# Patient Record
Sex: Female | Born: 1995 | Race: Black or African American | Hispanic: No | Marital: Single | Smoking: Former smoker
Health system: Southern US, Community
[De-identification: ages and names within clinical notes are randomized; demographics above are authoritative.]

## PROBLEM LIST (undated history)

## (undated) ENCOUNTER — Ambulatory Visit (HOSPITAL_COMMUNITY): Discharge status: Absent without leave | Payer: Self-pay

## (undated) ENCOUNTER — Inpatient Hospital Stay (HOSPITAL_COMMUNITY): Payer: Self-pay

## (undated) ENCOUNTER — Emergency Department (HOSPITAL_BASED_OUTPATIENT_CLINIC_OR_DEPARTMENT_OTHER): Admission: EM | Payer: Self-pay | Source: Home / Self Care

## (undated) DIAGNOSIS — J45909 Unspecified asthma, uncomplicated: Secondary | ICD-10-CM

## (undated) DIAGNOSIS — R011 Cardiac murmur, unspecified: Secondary | ICD-10-CM

## (undated) DIAGNOSIS — K219 Gastro-esophageal reflux disease without esophagitis: Secondary | ICD-10-CM

## (undated) DIAGNOSIS — F419 Anxiety disorder, unspecified: Secondary | ICD-10-CM

## (undated) HISTORY — DX: Cardiac murmur, unspecified: R01.1

## (undated) HISTORY — PX: TYMPANOSTOMY TUBE PLACEMENT: SHX32

## (undated) HISTORY — PX: TONSILLECTOMY: SUR1361

---
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
## 2015-07-29 ENCOUNTER — Encounter (HOSPITAL_COMMUNITY): Payer: Self-pay | Admitting: *Deleted

## 2015-07-29 ENCOUNTER — Emergency Department (HOSPITAL_COMMUNITY)
Admission: EM | Admit: 2015-07-29 | Discharge: 2015-07-29 | Disposition: A | Discharge status: Home / Self Care | Payer: Medicaid Other | Attending: Emergency Medicine | Admitting: Emergency Medicine

## 2015-07-29 DIAGNOSIS — K219 Gastro-esophageal reflux disease without esophagitis: Secondary | ICD-10-CM | POA: Diagnosis not present

## 2015-07-29 DIAGNOSIS — Z79899 Other long term (current) drug therapy: Secondary | ICD-10-CM | POA: Diagnosis not present

## 2015-07-29 DIAGNOSIS — L590 Erythema ab igne [dermatitis ab igne]: Secondary | ICD-10-CM | POA: Insufficient documentation

## 2015-07-29 DIAGNOSIS — Z8659 Personal history of other mental and behavioral disorders: Secondary | ICD-10-CM | POA: Diagnosis not present

## 2015-07-29 DIAGNOSIS — R011 Cardiac murmur, unspecified: Secondary | ICD-10-CM | POA: Insufficient documentation

## 2015-07-29 DIAGNOSIS — R21 Rash and other nonspecific skin eruption: Secondary | ICD-10-CM | POA: Diagnosis present

## 2015-07-29 MED ORDER — MUPIROCIN CALCIUM 2 % EX CREA: 1.0000 "application " | TOPICAL_CREAM | Freq: Two times a day (BID) | CUTANEOUS | Status: AC

## 2015-07-29 NOTE — ED Notes (Signed)
Pt presents with reddened rash to buttocks bilat.  Pt c/o itching.

## 2015-07-29 NOTE — ED Provider Notes (Signed)
CSN: [id number]     Arrival date & time 07/29/15  2007 History  By signing my name below, I, Regina Miller, attest that this documentation has been prepared under the direction and in the presence of  Earley Favor, NP. Electronically Signed: Doreatha Miller, ED Scribe. 07/29/2015. 9:24 PM.     Chief Complaint  Patient presents with  . Rash   The history is provided by the patient. No language interpreter was used.    HPI Comments: Regina Miller is a 19 y.o. female who presents to the Emergency Department complaining of a pruritic, non-painful rash to the buttock that has spread to the upper dorsal thighs onset 3 days ago. Pt states that she used nair on her buttock 3 days ago and noticed the rash after removing the nair. She states she used the regular depilatory, not the bikini line version. No rashes anywhere else.   Past Medical History  Diagnosis Date  . Anxiety   . Attention deficit disorder   . Acid reflux   . Heart murmur     Reports she had a heart murmur as a baby that has been evaluated. She is not currently followed by a cardiologist.   Past Surgical History  Procedure Laterality Date  . Tonsillectomy    . Tympanostomy tube placement Bilateral    Family History  Problem Relation Age of Onset  . Psoriasis Mother   . Hypertension Father   . Eczema Father   . Diabetes Father   . Heart disease Paternal Grandmother   . Cancer Maternal Grandmother    Social History  Substance Use Topics  . Smoking status: Passive Smoke Exposure - Never Smoker  . Smokeless tobacco: None     Comment: Parents smoke around her  . Alcohol Use: No   OB History    No data available     Review of Systems  Skin: Positive for rash ( pruritic, not painful).  All other systems reviewed and are negative.  Allergies  Other  Home Medications   Prior to Admission medications   Medication Sig Start Date End Date Taking? Authorizing Provider  azithromycin (ZITHROMAX) 500 MG tablet Take 2  tablets (1,000 mg total) by mouth once. Patient not taking: Reported on 07/29/2015 05/17/14   Linna Hoff, MD  fluconazole (DIFLUCAN) 150 MG tablet Take 1 tablet (150 mg total) by mouth daily. Repeat dose in 3 days Patient not taking: Reported on 07/29/2015 06/06/14   Ozella Rocks, MD  hydrocortisone 2.5 % ointment Apply topically 2 (two) times daily. As needed for mild eczema.  Do not use for more than 1-2 weeks at a time. Patient not taking: Reported on 07/29/2015 12/22/13   Radene Gunning, MD  metroNIDAZOLE (FLAGYL) 500 MG tablet Take 1 tablet (500 mg total) by mouth 2 (two) times daily. Patient not taking: Reported on 07/29/2015 05/17/14   Linna Hoff, MD  mupirocin cream (BACTROBAN) 2 % Apply 1 application topically 2 (two) times daily. 07/29/15 08/03/15  Earley Favor, NP  neomycin-polymyxin-hydrocortisone (CORTISPORIN) 3.5-10000-1 otic suspension Place 4 drops into both ears 4 (four) times daily. Patient not taking: Reported on 07/29/2015 05/14/14   Servando Salina, NP  ondansetron (ZOFRAN-ODT) 4 MG disintegrating tablet 1 tablet by mouth 5 minutes prior to taking antibiotic Patient not taking: Reported on 07/29/2015 05/18/14   Graylon Good, PA-C  Probiotic Product (ALIGN) 4 MG CAPS Take 1 capsule (4 mg total) by mouth daily. Patient not taking: Reported on 07/29/2015  05/14/14   Servando Salina, NP   BP 119/56 mmHg  Pulse 65  Temp(Src) 98.6 F (37 C) (Oral)  Resp 16  Ht  (1.549 m)  Wt 210 lb (95.255 kg)  BMI 39.70 kg/m2  SpO2 100% Physical Exam  Constitutional: She is oriented to person, place, and time. She appears well-developed and well-nourished.  HENT:  Head: Normocephalic and atraumatic.  Eyes: Conjunctivae and EOM are normal. Pupils are equal, round, and reactive to light.  Neck: Normal range of motion. Neck supple.  Cardiovascular: Normal rate.   Pulmonary/Chest: Effort normal. No respiratory distress.  Abdominal: She exhibits no distension.  Musculoskeletal: Normal  range of motion.  Neurological: She is alert and oriented to person, place, and time.  Skin: Skin is warm and dry. Rash noted.  Fine rash at base of each hair follical   Psychiatric: She has a normal mood and affect. Her behavior is normal.  Nursing note and vitals reviewed.  ED Course  Procedures (including critical care time) DIAGNOSTIC STUDIES: Oxygen Saturation is 100% on RA, normal by my interpretation.    COORDINATION OF CARE: 9:24 PM Discussed treatment plan with pt at bedside and pt agreed to plan.   MDM   Final diagnoses:  Dermatitis ab igne    I personally performed the services described in this documentation, which was scribed in my presence. The recorded information has been reviewed and is accurate.  Earley Favor, NP 07/29/15 2200  Alvira Monday, MD 08/01/15 Moses Manners

## 2015-07-29 NOTE — ED Notes (Signed)
PA informed of pt's request.  Pt provided option to stay for exam.  Pt stated she "will just wait and go somewhere else later."

## 2015-07-29 NOTE — ED Notes (Signed)
Pt states "I used Nair 2-3 days ago to remove the hair.  I used the body one".

## 2015-07-29 NOTE — ED Notes (Signed)
Pt now requesting to be checked for a UTI & stating "I've also been having a d/c."

## 2015-07-29 NOTE — Discharge Instructions (Signed)
You have a local reaction to the use of nair with potentially a superficial skin infection at the base of the hair follicles  Use the ointment as instructed 2 times a day for 5 days

## 2015-08-23 ENCOUNTER — Emergency Department (HOSPITAL_COMMUNITY)
Admission: EM | Admit: 2015-08-23 | Discharge: 2015-08-23 | Disposition: A | Discharge status: Home / Self Care | Payer: Medicaid Other | Attending: Emergency Medicine | Admitting: Emergency Medicine

## 2015-08-23 DIAGNOSIS — R011 Cardiac murmur, unspecified: Secondary | ICD-10-CM | POA: Diagnosis not present

## 2015-08-23 DIAGNOSIS — Z8719 Personal history of other diseases of the digestive system: Secondary | ICD-10-CM | POA: Diagnosis not present

## 2015-08-23 DIAGNOSIS — H9202 Otalgia, left ear: Secondary | ICD-10-CM | POA: Diagnosis present

## 2015-08-23 DIAGNOSIS — H66002 Acute suppurative otitis media without spontaneous rupture of ear drum, left ear: Secondary | ICD-10-CM | POA: Insufficient documentation

## 2015-08-23 DIAGNOSIS — Z8659 Personal history of other mental and behavioral disorders: Secondary | ICD-10-CM | POA: Insufficient documentation

## 2015-08-23 DIAGNOSIS — R0981 Nasal congestion: Secondary | ICD-10-CM | POA: Insufficient documentation

## 2015-08-23 MED ORDER — TRAMADOL HCL 50 MG PO TABS: 50.0000 mg | ORAL_TABLET | Freq: Four times a day (QID) | ORAL | Status: DC | PRN

## 2015-08-23 MED ORDER — AMOXICILLIN 500 MG PO CAPS: 500.0000 mg | ORAL_CAPSULE | Freq: Three times a day (TID) | ORAL | Status: DC

## 2015-08-23 NOTE — Discharge Instructions (Signed)

## 2015-08-23 NOTE — ED Notes (Signed)
Pt c/o pain in L ear, states its hard to hear out of it.

## 2015-08-23 NOTE — ED Provider Notes (Signed)
CSN: [id number]     Arrival date & time 08/23/15  2121 History  By signing my name below, I, Regina Miller, attest that this documentation has been prepared under the direction and in the presence of Arthor CaptainAbigail Halim Surrette, PA-C.  Electronically Signed: [name], ED Scribe. 08/23/2015. 9:45 PM.  Chief Complaint  Patient presents with  . Otalgia   The history is provided by the patient. No language interpreter was used.     HPI Comments: Regina Miller is a 19 y.o. female who presents to the Emergency Department complaining of gradual onset, intermittent, 4/10, left ear pain x 2-3 days, gradually worsening. Pt also complains of muffled hearing and nasal congestion. Pt notes that she had bilateral ear pain but put hydrogen peroxide in ears which relieved the right ear but had no relief with the left ear. Denies fever, neck stiffness, or any other associated symptoms. No recent altitude changes.   Past Medical History  Diagnosis Date  . Anxiety   . Attention deficit disorder   . Acid reflux   . Heart murmur     Reports she had a heart murmur as a baby that has been evaluated. She is not currently followed by a cardiologist.   Past Surgical History  Procedure Laterality Date  . Tonsillectomy    . Tympanostomy tube placement Bilateral    Family History  Problem Relation Age of Onset  . Psoriasis Mother   . Hypertension Father   . Eczema Father   . Diabetes Father   . Heart disease Paternal Grandmother   . Cancer Maternal Grandmother    Social History  Substance Use Topics  . Smoking status: Passive Smoke Exposure - Never Smoker  . Smokeless tobacco: Not on file     Comment: Parents smoke around her  . Alcohol Use: No   OB History    No data available     Review of Systems  Constitutional: Negative for fever and chills.  HENT: Positive for congestion and ear pain (Right ear). Negative for ear discharge.        + Muffled hearing of right ear   Allergies  Other  Home  Medications   Prior to Admission medications   Medication Sig Start Date End Date Taking? Authorizing Provider  azithromycin (ZITHROMAX) 500 MG tablet Take 2 tablets (1,000 mg total) by mouth once. Patient not taking: Reported on 07/29/2015 05/17/14   Linna HoffJames D Kindl, MD  fluconazole (DIFLUCAN) 150 MG tablet Take 1 tablet (150 mg total) by mouth daily. Repeat dose in 3 days Patient not taking: Reported on 07/29/2015 06/06/14   Ozella Rocksavid J Merrell, MD  hydrocortisone 2.5 % ointment Apply topically 2 (two) times daily. As needed for mild eczema.  Do not use for more than 1-2 weeks at a time. Patient not taking: Reported on 07/29/2015 12/22/13   Radene Gunningameron E Lang, MD  metroNIDAZOLE (FLAGYL) 500 MG tablet Take 1 tablet (500 mg total) by mouth 2 (two) times daily. Patient not taking: Reported on 07/29/2015 05/17/14   Linna HoffJames D Kindl, MD  neomycin-polymyxin-hydrocortisone (CORTISPORIN) 3.5-10000-1 otic suspension Place 4 drops into both ears 4 (four) times daily. Patient not taking: Reported on 07/29/2015 05/14/14   Servando Salinaatherine H Rossi, NP  ondansetron (ZOFRAN-ODT) 4 MG disintegrating tablet 1 tablet by mouth 5 minutes prior to taking antibiotic Patient not taking: Reported on 07/29/2015 05/18/14   Graylon GoodZachary H Baker, PA-C  Probiotic Product (ALIGN) 4 MG CAPS Take 1 capsule (4 mg total) by mouth daily. Patient not  taking: Reported on 07/29/2015 05/14/14   Servando Salina, NP   Triage Vitals: BP 138/68 mmHg  Pulse 100  Temp(Src) 98.5 F (36.9 C) (Oral)  Resp 16  SpO2 100%   Physical Exam  Constitutional: She is oriented to person, place, and time. She appears well-developed and well-nourished. No distress.  HENT:  Head: Normocephalic and atraumatic.  Right TM is retracted; small burn from the peroxide  Left TM; air fluid levels with fluid behind drums; erythematous  Eyes: Conjunctivae and EOM are normal.  Neck: Neck supple. No tracheal deviation present.  Cardiovascular: Normal rate.   Pulmonary/Chest: Effort normal.  No respiratory distress.  Musculoskeletal: Normal range of motion.  Neurological: She is alert and oriented to person, place, and time.  Skin: Skin is warm and dry.  Psychiatric: She has a normal mood and affect. Her behavior is normal.  Nursing note and vitals reviewed.   ED Course  Procedures (including critical care time)  DIAGNOSTIC STUDIES: Oxygen Saturation is 100%, on RA, normal by my interpretation.    COORDINATION OF CARE: 9:43 PM-Discussed treatment plan which includes rx amoxicillin with pt at bedside and pt agreed to plan.   Labs Review Labs Reviewed - No data to display  Imaging Review No results found.   EKG Interpretation None      MDM   Final diagnoses:  Acute suppurative otitis media of left ear without spontaneous rupture of tympanic membrane, recurrence not specified   Patient presents with otalgia and exam consistent with acute otitis media. No concern for acute mastoiditis, meningitis.  No antibiotic use in the last month.  Patient discharged home with Amoxicillin. .  I have also discussed reasons to return immediately to the ER.  Parent expresses understanding and agrees with plan.     I personally performed the services described in this documentation, which was scribed in my presence. The recorded information has been reviewed and is accurate.         Arthor Captain, PA-C 08/23/15 2212  Pricilla Loveless, MD 08/24/15 [phone]

## 2015-09-06 ENCOUNTER — Emergency Department (HOSPITAL_COMMUNITY)
Admission: EM | Admit: 2015-09-06 | Discharge: 2015-09-06 | Disposition: A | Discharge status: Home / Self Care | Payer: Medicaid Other | Attending: Emergency Medicine | Admitting: Emergency Medicine

## 2015-09-06 ENCOUNTER — Encounter (HOSPITAL_COMMUNITY): Payer: Self-pay | Admitting: Cardiology

## 2015-09-06 DIAGNOSIS — N72 Inflammatory disease of cervix uteri: Secondary | ICD-10-CM | POA: Diagnosis not present

## 2015-09-06 DIAGNOSIS — R011 Cardiac murmur, unspecified: Secondary | ICD-10-CM | POA: Insufficient documentation

## 2015-09-06 DIAGNOSIS — Z8719 Personal history of other diseases of the digestive system: Secondary | ICD-10-CM | POA: Insufficient documentation

## 2015-09-06 DIAGNOSIS — N390 Urinary tract infection, site not specified: Secondary | ICD-10-CM

## 2015-09-06 DIAGNOSIS — F419 Anxiety disorder, unspecified: Secondary | ICD-10-CM | POA: Insufficient documentation

## 2015-09-06 DIAGNOSIS — R509 Fever, unspecified: Secondary | ICD-10-CM | POA: Diagnosis present

## 2015-09-06 DIAGNOSIS — Z3202 Encounter for pregnancy test, result negative: Secondary | ICD-10-CM | POA: Diagnosis not present

## 2015-09-06 LAB — URINALYSIS, ROUTINE W REFLEX MICROSCOPIC
Bilirubin Urine: NEGATIVE
Bilirubin Urine: NEGATIVE
Glucose, UA: NEGATIVE mg/dL
Glucose, UA: NEGATIVE mg/dL
Ketones, ur: NEGATIVE mg/dL
Ketones, ur: NEGATIVE mg/dL
NITRITE: NEGATIVE
Nitrite: NEGATIVE
PH: 6 (ref 5.0–8.0)
Protein, ur: 30 mg/dL — AB
Protein, ur: NEGATIVE mg/dL
SPECIFIC GRAVITY, URINE: 1.026 (ref 1.005–1.030)
SPECIFIC GRAVITY, URINE: 1.023 (ref 1.005–1.030)
pH: 6 (ref 5.0–8.0)

## 2015-09-06 LAB — URINE MICROSCOPIC-ADD ON

## 2015-09-06 LAB — WET PREP, GENITAL
Clue Cells Wet Prep HPF POC: NONE SEEN
Sperm: NONE SEEN
Trich, Wet Prep: NONE SEEN
Yeast Wet Prep HPF POC: NONE SEEN

## 2015-09-06 LAB — POC URINE PREG, ED: PREG TEST UR: NEGATIVE

## 2015-09-06 MED ORDER — NITROFURANTOIN MONOHYD MACRO 100 MG PO CAPS: 100.0000 mg | ORAL_CAPSULE | Freq: Two times a day (BID) | ORAL | Status: DC

## 2015-09-06 MED ORDER — ONDANSETRON 4 MG PO TBDP
8.0000 mg | ORAL_TABLET | Freq: Once | ORAL | Status: AC
  Administered 2015-09-06: 8 mg via ORAL
  Filled 2015-09-06: qty 2

## 2015-09-06 MED ORDER — ONDANSETRON 4 MG PO TBDP: 4.0000 mg | ORAL_TABLET | Freq: Three times a day (TID) | ORAL | Status: DC | PRN

## 2015-09-06 MED ORDER — LIDOCAINE HCL (PF) 1 % IJ SOLN
2.0000 mL | Freq: Once | INTRAMUSCULAR | Status: AC
  Administered 2015-09-06: 2 mL via INTRADERMAL
  Filled 2015-09-06: qty 5

## 2015-09-06 MED ORDER — CEFTRIAXONE SODIUM 250 MG IJ SOLR
250.0000 mg | Freq: Once | INTRAMUSCULAR | Status: AC
  Administered 2015-09-06: 250 mg via INTRAMUSCULAR
  Filled 2015-09-06: qty 250

## 2015-09-06 MED ORDER — AZITHROMYCIN 250 MG PO TABS
1000.0000 mg | ORAL_TABLET | Freq: Once | ORAL | Status: AC
  Administered 2015-09-06: 1000 mg via ORAL
  Filled 2015-09-06: qty 4

## 2015-09-06 NOTE — ED Provider Notes (Signed)
CSN: [id number]     Arrival date & time 09/06/15  1224 History   First MD Initiated Contact with Patient 09/06/15 1452     Chief Complaint  Patient presents with  . Fever  . Vaginal Discharge     (Consider location/radiation/quality/duration/timing/severity/associated sxs/prior Treatment) HPI Comments: 19 year old female who presents with fever, dysuria, and vaginal discharge. The patient states that 3-4 days ago she began having brown vaginal discharge associated with burning and dysuria. She has noted fevers up to 102 at home, last fever was 101.6 last night. She has had a mild amount of vomiting but no diarrhea. No abdominal pain. Her last menstrual period was on 10/25. She has one sexual partner and does not use protection. She does have a history of chlamydia.  Patient is a 19 y.o. female presenting with fever and vaginal discharge. The history is provided by the patient.  Fever Vaginal Discharge Associated symptoms: fever     Past Medical History  Diagnosis Date  . Anxiety   . Attention deficit disorder   . Acid reflux   . Heart murmur     Reports she had a heart murmur as a baby that has been evaluated. She is not currently followed by a cardiologist.   Past Surgical History  Procedure Laterality Date  . Tonsillectomy    . Tympanostomy tube placement Bilateral    Family History  Problem Relation Age of Onset  . Psoriasis Mother   . Hypertension Father   . Eczema Father   . Diabetes Father   . Heart disease Paternal Grandmother   . Cancer Maternal Grandmother    Social History  Substance Use Topics  . Smoking status: Current Every Day Smoker  . Smokeless tobacco: None     Comment: Parents smoke around her  . Alcohol Use: Yes   OB History    No data available     Review of Systems  Constitutional: Positive for fever.  Genitourinary: Positive for vaginal discharge.   10 Systems reviewed and are negative for acute change except as noted in the  HPI.    Allergies  Other  Home Medications   Prior to Admission medications   Medication Sig Start Date End Date Taking? Authorizing Provider  traMADol (ULTRAM) 50 MG tablet Take 1 tablet (50 mg total) by mouth every 6 (six) hours as needed. 08/23/15  Yes Arthor CaptainAbigail Harris, PA-C  nitrofurantoin, macrocrystal-monohydrate, (MACROBID) 100 MG capsule Take 1 capsule (100 mg total) by mouth 2 (two) times daily. 09/06/15   Ambrose Finlandachel Morgan Taro Hidrogo, MD   BP 105/74 mmHg  Pulse 84  Temp(Src) 99 F (37.2 C) (Oral)  Resp 16  Ht 5\' 5"  (1.651 m)  Wt 210 lb (95.255 kg)  BMI 34.95 kg/m2  SpO2 100%  LMP 08/07/2015 Physical Exam  Constitutional: She is oriented to person, place, and time. She appears well-developed and well-nourished. No distress.  HENT:  Head: Normocephalic and atraumatic.  Mouth/Throat: Oropharynx is clear and moist.  Moist mucous membranes  Eyes: Conjunctivae are normal. Pupils are equal, round, and reactive to light.  Neck: Neck supple.  Cardiovascular: Normal rate, regular rhythm and normal heart sounds.   No murmur heard. Pulmonary/Chest: Effort normal and breath sounds normal.  Abdominal: Soft. Bowel sounds are normal. She exhibits no distension. There is no tenderness.  Genitourinary:  large amount of thin discharge in vaginal vault and on bed, strawberry cervix with significant irritation of mucosa, no adnexal or cervical motion tenderness  Musculoskeletal: She exhibits no edema.  Neurological: She is alert and oriented to person, place, and time.  Fluent speech  Skin: Skin is warm and dry. No rash noted.  Psychiatric: She has a normal mood and affect. Judgment normal.  Nursing note and vitals reviewed. Chaperone was present during exam.   ED Course  Procedures (including critical care time) Labs Review Labs Reviewed  WET PREP, GENITAL - Abnormal; Notable for the following:    WBC, Wet Prep HPF POC MANY (*)    All other components within normal limits   URINALYSIS, ROUTINE W REFLEX MICROSCOPIC (NOT AT [facility]) - Abnormal; Notable for the following:    APPearance CLOUDY (*)    Hgb urine dipstick MODERATE (*)    Protein, ur 30 (*)    Leukocytes, UA MODERATE (*)    All other components within normal limits  URINE MICROSCOPIC-ADD ON - Abnormal; Notable for the following:    Squamous Epithelial / LPF 6-30 (*)    Bacteria, UA MANY (*)    All other components within normal limits  URINALYSIS, ROUTINE W REFLEX MICROSCOPIC (NOT AT [facility]) - Abnormal; Notable for the following:    Hgb urine dipstick LARGE (*)    Leukocytes, UA SMALL (*)    All other components within normal limits  URINE MICROSCOPIC-ADD ON - Abnormal; Notable for the following:    Squamous Epithelial / LPF 0-5 (*)    Bacteria, UA RARE (*)    All other components within normal limits  URINE CULTURE  POC URINE PREG, ED  GC/CHLAMYDIA PROBE AMP ([place]) NOT AT [facility]    Imaging Review No results found. I have personally reviewed and evaluated these lab results as part of my medical decision-making.   EKG Interpretation None     Medications  cefTRIAXone (ROCEPHIN) injection 250 mg (not administered)  lidocaine (PF) (XYLOCAINE) 1 % injection 2 mL (not administered)  ondansetron (ZOFRAN-ODT) disintegrating tablet 8 mg (not administered)  azithromycin (ZITHROMAX) tablet 1,000 mg (not administered)    MDM   Final diagnoses:  Cervicitis  UTI (lower urinary tract infection)   She presents with several days of vaginal discharge, dysuria, and intermittent fevers. She denies any abdominal pain. Patient well-appearing with normal vital signs. No abdominal tenderness on exam. GU exam showed large amount of thin vaginal discharge and strawberry cervix c/w infection. Gave pt CTX and azithromycin. GC/chlamydia and wet prep sent.  Obtained repeat UA for contamination; repeat shows WBCs, bacteria, and leuks. Will treat with outpt abx for UTI given dysuria. Instructed on supportive  care and treating partner before any sexual contact. Patient voiced understanding of return precautions and was discharged in satisfactory condition.      Laurence Spates, MD 09/06/15 [phone]

## 2015-09-06 NOTE — ED Notes (Signed)
Reports a fever, vaginal discharge, and dysuria for the past couple of days.

## 2015-09-07 LAB — URINE CULTURE
Culture: NO GROWTH
SPECIAL REQUESTS: NORMAL

## 2015-09-07 LAB — GC/CHLAMYDIA PROBE AMP (~~LOC~~) NOT AT ARMC
Chlamydia: NEGATIVE
NEISSERIA GONORRHEA: NEGATIVE

## 2016-05-15 ENCOUNTER — Encounter: Payer: Self-pay | Admitting: Pediatrics

## 2016-10-14 ENCOUNTER — Encounter (HOSPITAL_COMMUNITY): Payer: Self-pay | Admitting: *Deleted

## 2016-10-14 DIAGNOSIS — I493 Ventricular premature depolarization: Secondary | ICD-10-CM | POA: Insufficient documentation

## 2016-10-14 DIAGNOSIS — R002 Palpitations: Secondary | ICD-10-CM | POA: Diagnosis present

## 2016-10-14 DIAGNOSIS — F17200 Nicotine dependence, unspecified, uncomplicated: Secondary | ICD-10-CM | POA: Insufficient documentation

## 2016-10-14 NOTE — ED Triage Notes (Signed)
Pt c/o heart palpitations x 1 week; pt denies any extra caffeine or diet pills

## 2016-10-15 ENCOUNTER — Emergency Department (HOSPITAL_COMMUNITY)
Admission: EM | Admit: 2016-10-15 | Discharge: 2016-10-15 | Disposition: A | Discharge status: Home / Self Care | Payer: Medicaid Other | Attending: Emergency Medicine | Admitting: Emergency Medicine

## 2016-10-15 DIAGNOSIS — R002 Palpitations: Secondary | ICD-10-CM

## 2016-10-15 DIAGNOSIS — I493 Ventricular premature depolarization: Secondary | ICD-10-CM

## 2016-10-15 NOTE — ED Provider Notes (Signed)
[facility]-EMER[facility]Y DEPT Provider Note   CSN: [id number] Arrival date & time: 10/14/16  2323     History   Chief Complaint Chief Complaint  Patient presents with  . Palpitations    HPI Regina Miller is a 20 y.o. female.  HPI Patient presents emergency department complaints of occasional palpitations over the past week.  She reports his palpitations last a few seconds and feels like a skipped beat.  She denies near syncope or syncope.  No new medications.  Patient denies use of caffeine.  No over-the-counter herbal supplements.  No palpitations at this time.  Symptoms are mild in severity.   Past Medical History:  Diagnosis Date  . Acid reflux   . Anxiety   . Attention deficit disorder   . Heart murmur    Reports she had a heart murmur as a baby that has been evaluated. She is not currently followed by a cardiologist.    Patient Active Problem List   Diagnosis Date Noted  . Anxiety 12/22/2013  . Eczema 12/22/2013  . Body mass index, pediatric, greater than or 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 age 73/02/2014  . Acne 04/26/2012  . Insomnia 07/06/2011  . GERD 09/19/2008  . ATTENTION DEFICIT, W/HYPERACTIVITY 12/17/2006    Past Surgical History:  Procedure Laterality Date  . TONSILLECTOMY    . TYMPANOSTOMY TUBE PLACEMENT Bilateral     OB History    No data available       Home Medications    Prior to Admission medications   Not on File    Family History Family History  Problem Relation Age of Onset  . Psoriasis Mother   . Hypertension Father   . Eczema Father   . Diabetes Father   . Heart disease Paternal Grandmother   . Cancer Maternal Grandmother     Social History Social History  Substance Use Topics  . Smoking status: Current Every Day Smoker  . Smokeless tobacco: Never Used     Comment: Parents smoke around her  . Alcohol use Yes     Allergies   Other   Review of Systems Review of Systems  All other systems reviewed and are  negative.    Physical Exam Updated Vital Signs BP 125/65   Pulse 72   Temp 97.9 F (36.6 C) (Oral)   Resp 22   Ht 5\' 2"  (1.575 m)   Wt 188 lb (85.3 kg)   LMP 09/23/2016   SpO2 100%   BMI 34.39 kg/m   Physical Exam  Constitutional: She is oriented to person, place, and time. She appears well-developed and well-nourished.  HENT:  Head: Normocephalic.  Eyes: EOM are normal.  Neck: Normal range of motion.  Cardiovascular: Normal rate and regular rhythm.   Pulmonary/Chest: Effort normal and breath sounds normal.  Abdominal: She exhibits no distension.  Musculoskeletal: Normal range of motion.  Neurological: She is alert and oriented to person, place, and time.  Psychiatric: She has a normal mood and affect.  Nursing note and vitals reviewed.    ED Treatments / Results  Labs (all labs ordered are listed, but only abnormal results are displayed) Labs Reviewed - No data to display  EKG  EKG Interpretation  Date/Time:  Wednesday October 15 2016 01:04:56 EST Ventricular Rate:  75 PR Interval:    QRS Duration: 81 QT Interval:  376 QTC Calculation: 420 R Axis:   40 Text Interpretation:  Sinus rhythm Low voltage, precordial leads No significant change was found Confirmed by  Landen Knoedler  MD, Caryn BeeKEVIN ([id number]) on 10/15/2016 1:36:43 AM       Radiology No results found.  Procedures Procedures (including critical care time)  Medications Ordered in ED Medications - No data to display   Initial Impression / Assessment and Plan / ED Course  I have reviewed the triage vital signs and the nursing notes.  Pertinent labs & imaging results that were available during my care of the patient were reviewed by me and considered in my medical decision making (see chart for details).  Clinical Course     Occasional PVCs noted on the monitor.  These seem to correlate with patient's symptoms.  Outpatient cardiology if her symptoms persist that she may benefit from Holter but I suspect  the majority of this is all PVCs.  This was explained to the patient.  Recommend oral hydration and avoidance of caffeine and other stimulants   Final Clinical Impressions(s) / ED Diagnoses   Final diagnoses:  Palpitations  PVC's (premature ventricular contractions)    New Prescriptions There are no discharge medications for this patient.    Azalia BilisKevin Gavriel Holzhauer, MD 10/15/16 [phone]

## 2017-07-23 ENCOUNTER — Encounter (HOSPITAL_COMMUNITY): Payer: Self-pay | Admitting: Emergency Medicine

## 2017-07-23 ENCOUNTER — Emergency Department (HOSPITAL_COMMUNITY)
Admission: EM | Admit: 2017-07-23 | Discharge: 2017-07-23 | Disposition: A | Discharge status: Home / Self Care | Payer: Medicaid Other | Attending: Emergency Medicine | Admitting: Emergency Medicine

## 2017-07-23 DIAGNOSIS — O99519 Diseases of the respiratory system complicating pregnancy, unspecified trimester: Secondary | ICD-10-CM | POA: Diagnosis not present

## 2017-07-23 DIAGNOSIS — J45909 Unspecified asthma, uncomplicated: Secondary | ICD-10-CM | POA: Insufficient documentation

## 2017-07-23 DIAGNOSIS — O219 Vomiting of pregnancy, unspecified: Secondary | ICD-10-CM

## 2017-07-23 DIAGNOSIS — R112 Nausea with vomiting, unspecified: Secondary | ICD-10-CM | POA: Diagnosis present

## 2017-07-23 DIAGNOSIS — F17210 Nicotine dependence, cigarettes, uncomplicated: Secondary | ICD-10-CM | POA: Insufficient documentation

## 2017-07-23 DIAGNOSIS — Z3A00 Weeks of gestation of pregnancy not specified: Secondary | ICD-10-CM | POA: Insufficient documentation

## 2017-07-23 DIAGNOSIS — O99330 Smoking (tobacco) complicating pregnancy, unspecified trimester: Secondary | ICD-10-CM | POA: Diagnosis not present

## 2017-07-23 DIAGNOSIS — Z331 Pregnant state, incidental: Secondary | ICD-10-CM

## 2017-07-23 HISTORY — DX: Unspecified asthma, uncomplicated: J45.909

## 2017-07-23 LAB — URINALYSIS, ROUTINE W REFLEX MICROSCOPIC
BILIRUBIN URINE: NEGATIVE
Bilirubin Urine: NEGATIVE
Glucose, UA: NEGATIVE mg/dL
Glucose, UA: NEGATIVE mg/dL
Ketones, ur: NEGATIVE mg/dL
Ketones, ur: NEGATIVE mg/dL
NITRITE: NEGATIVE
Nitrite: NEGATIVE
PH: 6 (ref 5.0–8.0)
PROTEIN: NEGATIVE mg/dL
Protein, ur: NEGATIVE mg/dL
SPECIFIC GRAVITY, URINE: 1.02 (ref 1.005–1.030)
Specific Gravity, Urine: 1.025 (ref 1.005–1.030)
pH: 5 (ref 5.0–8.0)

## 2017-07-23 LAB — COMPREHENSIVE METABOLIC PANEL
ALT: 19 U/L (ref 14–54)
AST: 21 U/L (ref 15–41)
Albumin: 3.7 g/dL (ref 3.5–5.0)
Alkaline Phosphatase: 71 U/L (ref 38–126)
Anion gap: 7 (ref 5–15)
BUN: 7 mg/dL (ref 6–20)
CHLORIDE: 106 mmol/L (ref 101–111)
CO2: 23 mmol/L (ref 22–32)
Calcium: 9 mg/dL (ref 8.9–10.3)
Creatinine, Ser: 0.91 mg/dL (ref 0.44–1.00)
Glucose, Bld: 104 mg/dL — ABNORMAL HIGH (ref 65–99)
POTASSIUM: 3.6 mmol/L (ref 3.5–5.1)
SODIUM: 136 mmol/L (ref 135–145)
Total Bilirubin: 0.5 mg/dL (ref 0.3–1.2)
Total Protein: 6.4 g/dL — ABNORMAL LOW (ref 6.5–8.1)

## 2017-07-23 LAB — CBC
HCT: 37 % (ref 36.0–46.0)
Hemoglobin: 12.9 g/dL (ref 12.0–15.0)
MCH: 32.7 pg (ref 26.0–34.0)
MCHC: 34.9 g/dL (ref 30.0–36.0)
MCV: 93.7 fL (ref 78.0–100.0)
PLATELETS: 321 10*3/uL (ref 150–400)
RBC: 3.95 MIL/uL (ref 3.87–5.11)
RDW: 12.8 % (ref 11.5–15.5)
WBC: 10.5 10*3/uL (ref 4.0–10.5)

## 2017-07-23 LAB — I-STAT BETA HCG BLOOD, ED (MC, WL, AP ONLY)

## 2017-07-23 LAB — LIPASE, BLOOD: LIPASE: 24 U/L (ref 11–51)

## 2017-07-23 MED ORDER — PRENATAL COMPLETE 14-0.4 MG PO TABS: 1.0000 | ORAL_TABLET | Freq: Every day | ORAL | 2 refills | Status: DC

## 2017-07-23 MED ORDER — DOXYLAMINE-PYRIDOXINE 10-10 MG PO TBEC: DELAYED_RELEASE_TABLET | ORAL | 0 refills | Status: DC

## 2017-07-23 NOTE — ED Notes (Signed)
Pt given cup and instructions for collecting proper urine sample. Pt ambulated to RR with steady gait.

## 2017-07-23 NOTE — Discharge Instructions (Signed)
You are pregnant, which is why you're having the symptoms you're having. Use diclegis as directed as needed for nausea. See the list of foods below to help with nausea related to pregnancy. Stay well hydrated, get plenty of rest. Start taking prenatal vitamins. Follow up with your regular doctor and OBGYN in the next 3-5 days for recheck of symptoms and to establish prenatal care. [facility]) for emergent changes or worsening symptoms.

## 2017-07-23 NOTE — ED Provider Notes (Signed)
[facility]-EMER[facility]ENCY DEPT Provider Note   CSN: [id number] Arrival date & time: 07/23/17  1015     History   Chief Complaint Chief Complaint  Patient presents with  . Nausea  . Emesis    HPI Regina Miller is a 21 y.o. female with a PMHx of GERD, asthma, anxiety, and ADD, who presents to the ED with complaints of  1 month of intermittent breast tenderness, intermittent nausea, and vomiting initially which has since resolved 3wks ago. Patient wonders whether she may be pregnant. She states that certain smells trigger her nausea, and it typically occurs in the mornings. Her breast tenderness is also typically in the mornings. She has not tried anything for her symptoms. She has not vomited in 3 weeks, when she was vomiting it was nonbloody and nonbilious. She is sexually active with one female partner, unprotected. LMP 06/12/17. She denies any breast warmth, rashes, redness, swelling, or any skin changes. She denies any fevers, chills, CP, SOB, abdominal pain, ongoing vomiting, hematemesis, diarrhea, constipation, melena, hematochezia, dysuria, hematuria, vaginal bleeding or discharge, arthralgias, numbness, tingling, focal weakness, or any other complaints at this time.   The history is provided by medical records and the patient. No language interpreter was used.    Past Medical History:  Diagnosis Date  . Acid reflux   . Anxiety   . Asthma   . Attention deficit disorder   . Heart murmur    Reports she had a heart murmur as a baby that has been evaluated. She is not currently followed by a cardiologist.    Patient Active Problem List   Diagnosis Date Noted  . Anxiety 12/22/2013  . Eczema 12/22/2013  . Body mass index, pediatric, greater than or 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 age 15/02/2014  . Acne 04/26/2012  . Insomnia 07/06/2011  . GERD 09/19/2008  . ATTENTION DEFICIT, W/HYPERACTIVITY 12/17/2006    Past Surgical History:  Procedure Laterality Date  . TONSILLECTOMY    .  TYMPANOSTOMY TUBE PLACEMENT Bilateral     OB History    No data available       Home Medications    Prior to Admission medications   Not on File    Family History Family History  Problem Relation Age of Onset  . Psoriasis Mother   . Hypertension Father   . Eczema Father   . Diabetes Father   . Heart disease Paternal Grandmother   . Cancer Maternal Grandmother     Social History Social History  Substance Use Topics  . Smoking status: Current Every Day Smoker    Packs/day: 1.00  . Smokeless tobacco: Never Used     Comment: Parents smoke around her  . Alcohol use Yes     Allergies   Other   Review of Systems Review of Systems  Constitutional: Negative for chills and fever.  Respiratory: Negative for shortness of breath.   Cardiovascular: Negative for chest pain.  Gastrointestinal: Positive for nausea and vomiting (none in 3wks). Negative for abdominal pain, blood in stool, constipation and diarrhea.  Genitourinary: Negative for dysuria, hematuria, vaginal bleeding and vaginal discharge.  Musculoskeletal: Positive for myalgias (tender breasts). Negative for arthralgias.  Skin: Negative for color change.  Allergic/Immunologic: Negative for immunocompromised state.  Neurological: Negative for weakness and numbness.  Psychiatric/Behavioral: Negative for confusion.   All other systems reviewed and are negative for acute change except as noted in the HPI.    Physical Exam Updated Vital Signs BP 129/70 (BP Location: Right Arm)  Pulse 94   Temp 98.7 F (37.1 C) (Oral)   Resp 20   Ht  (1.575 m)   Wt 90.7 kg (200 lb)   LMP 06/12/2017 (Exact Date)   SpO2 100%   BMI 36.58 kg/m   Physical Exam  Constitutional: She is oriented to person, place, and time. Vital signs are normal. She appears well-developed and well-nourished.  Non-toxic appearance. No distress.  Afebrile, nontoxic, NAD  HENT:  Head: Normocephalic and atraumatic.  Mouth/Throat: Oropharynx  is clear and moist and mucous membranes are normal.  Eyes: Conjunctivae and EOM are normal. Right eye exhibits no discharge. Left eye exhibits no discharge.  Neck: Normal range of motion. Neck supple.  Cardiovascular: Normal rate, regular rhythm, normal heart sounds and intact distal pulses.  Exam reveals no gallop and no friction rub.   No murmur heard. Pulmonary/Chest: Effort normal and breath sounds normal. No respiratory distress. She has no decreased breath sounds. She has no wheezes. She has no rhonchi. She has no rales.  Abdominal: Soft. Normal appearance and bowel sounds are normal. She exhibits no distension. There is no tenderness. There is no rigidity, no rebound, no guarding, no CVA tenderness, no tenderness at McBurney's point and negative Murphy's sign.  Soft, obese, NTND, +BS throughout, no r/g/r, neg murphy's, neg mcburney's, no CVA TTP   Musculoskeletal: Normal range of motion.  Neurological: She is alert and oriented to person, place, and time. She has normal strength. No sensory deficit.  Skin: Skin is warm, dry and intact. No rash noted.  Psychiatric: She has a normal mood and affect.  Nursing note and vitals reviewed.    ED Treatments / Results  Labs (all labs ordered are listed, but only abnormal results are displayed) Labs Reviewed  COMPREHENSIVE METABOLIC PANEL - Abnormal; Notable for the following:       Result Value   Glucose, Bld 104 (*)    Total Protein 6.4 (*)    All other components within normal limits  URINALYSIS, ROUTINE W REFLEX MICROSCOPIC - Abnormal; Notable for the following:    Color, Urine AMBER (*)    APPearance CLOUDY (*)    Hgb urine dipstick MODERATE (*)    Leukocytes, UA LARGE (*)    Bacteria, UA MANY (*)    Squamous Epithelial / LPF TOO NUMEROUS TO COUNT (*)    All other components within normal limits  URINALYSIS, ROUTINE W REFLEX MICROSCOPIC - Abnormal; Notable for the following:    APPearance HAZY (*)    Hgb urine dipstick MODERATE  (*)    Leukocytes, UA MODERATE (*)    Bacteria, UA RARE (*)    Squamous Epithelial / LPF 6-30 (*)    All other components within normal limits  I-STAT BETA HCG BLOOD, ED ([facility], [facility], [facility] ONLY) - Abnormal; Notable for the following:    I-stat hCG, quantitative >2,000.0 (*)    All other components within normal limits  URINE CULTURE  LIPASE, BLOOD  CBC    EKG  EKG Interpretation None       Radiology No results found.  Procedures Procedures (including critical care time)  Medications Ordered in ED Medications - No data to display   Initial Impression / Assessment and Plan / ED Course  I have reviewed the triage vital signs and the nursing notes.  Pertinent labs & imaging results that were available during my care of the patient were reviewed by me and considered in my medical decision making (see chart for details).  21 y.o. female here with breast tenderness and nausea x1 month, no vomiting since 3wks ago. Denies any other complaints. Concerned she may be pregnant. On exam, no abdominal tenderness, overall reassuring exam. Labs reveal +betaHCG, lipase WNL, CMP and CBC WNL, U/A so grossly contaminated that it's essentially useless so will need to recollect that. Pt tolerating PO well here, states nausea is intermittent with certain smells and in the morning, c/w normal morning sickness of early pregnancy. Doubt need for intervention at this time. Doubt need for further imaging/labs except U/A; Will await recollection of urine.   1:32 PM Repeat U/A still with 6-30 squamous so fairly contaminated, but rare bacteria and 0-5 WBCs so not really consistent with a UTI; no symptoms of UTI; UCx sent, doubt need for empiric tx of this contaminated specimen. Will send home with diclegis for morning sickness relief, discussed diet modifications to help as well, advised staying hydrated, will start on prenatals; f/up with PCP/OBGYN in 3-5 days for recheck of symptoms and to establish prenatal  care. I explained the diagnosis and have given explicit precautions to return to the ER including for any other new or worsening symptoms. The patient understands and accepts the medical plan as it's been dictated and I have answered their questions. Discharge instructions concerning home care and prescriptions have been given. The patient is STABLE and is discharged to home in good condition.    Final Clinical Impressions(s) / ED Diagnoses   Final diagnoses:  Nausea and vomiting during pregnancy  Pregnancy as incidental finding    New Prescriptions New Prescriptions   DOXYLAMINE-PYRIDOXINE 10-10 MG TBEC    2 tablets on an empty stomach at bedtime on day 1 and 2. If symptoms persist, take 1 tablet in the morning and 2 tablets at bedtime on day 3. If symptoms persist, increase to 1 tablet in the morning, 1 tablet in the afternoon, and 2 tablets at bedtime daily.   PRENATAL VIT-FE FUMARATE-FA (PRENATAL COMPLETE) 14-0.4 MG TABS    Take 1 tablet by mouth daily after breakfast.     [address] [place], [place] 07/23/17 1332    Cathren Laine, MD 07/24/17 1520

## 2017-07-23 NOTE — ED Triage Notes (Signed)
Per pt, pt reports nausea and vomiting every morning for the past two weeks. Pt reports breast are very painful in the morning and the pain lessens throughout the day. Pt reports she is worried she is pregnant.

## 2017-07-24 LAB — URINE CULTURE

## 2017-07-25 ENCOUNTER — Telehealth: Payer: Self-pay

## 2017-07-25 NOTE — Progress Notes (Signed)
ED Antimicrobial Stewardship Positive Culture Follow Up   Regina Miller is an 21 y.o. female who presented to [facility] on 07/23/2017 with a chief complaint of  Chief Complaint  Patient presents with  . Nausea  . Emesis    Recent Results (from the past 720 hour(s))  Urine culture     Status: Abnormal   Collection Time: 07/23/17  1:05 PM  Result Value Ref Range Status   Specimen Description URINE, CLEAN CATCH  Final   Special Requests NONE  Final   Culture (A)  Final    70,000 COLONIES/mL CORYNEBACTERIUM SPECIES Standardized susceptibility testing for this organism is not available.    Report Status 07/24/2017 FINAL  Final      Patient discharged originally without antimicrobial agent and treatment is now indicated  New antibiotic prescription: Cephalexin 500 mg BID X 7 days  ED Provider: Betsy Coder, PA-C   Della Goo, PharmD 07/25/2017, 9:27 AM PGY2 Infectious Diseases Pharmacy Resident Phone# [phone]

## 2017-07-25 NOTE — Telephone Encounter (Signed)
Post ED Visit - Positive Culture Follow-up: Successful Patient Follow-Up  Culture assessed and recommendations reviewed by:  Enzo Bi, Pharm.D.  Celedonio Miyamoto, Pharm.D., BCPS AQ-ID  Garvin Fila, Pharm.D., BCPS  Georgina Pillion, [address].D., BCPS  [place], [address].D., BCPS, AAHIVP  Estella Husk, Pharm.D., BCPS, AAHIVP  Lysle Pearl, PharmD, BCPS  Casilda Carls, PharmD, BCPS  Pollyann Samples, PharmD, BCPS Sharin Mons Pharm D Positive urine culture   Patient discharged without antimicrobial prescription and treatment is now indicated  Organism is resistant to prescribed ED discharge antimicrobial  Patient with positive blood cultures  Changes discussed with ED provider: Everlene Farrier PA-C New antibiotic prescription Cephalexin 500 mg BID x7 days Called to [facility] [phone]  Contacted patient, date 07/25/17, time 1144   Brighten Orndoff, Linnell Fulling 07/25/2017, 11:41 AM

## 2017-07-26 ENCOUNTER — Inpatient Hospital Stay (HOSPITAL_COMMUNITY): Payer: Medicaid Other

## 2017-07-26 ENCOUNTER — Inpatient Hospital Stay (HOSPITAL_COMMUNITY)
Admission: AD | Admit: 2017-07-26 | Discharge: 2017-07-26 | Disposition: A | Discharge status: Home / Self Care | Payer: Medicaid Other | Source: Ambulatory Visit | Attending: Obstetrics & Gynecology | Admitting: Obstetrics & Gynecology

## 2017-07-26 ENCOUNTER — Encounter (HOSPITAL_COMMUNITY): Payer: Self-pay

## 2017-07-26 DIAGNOSIS — O99331 Smoking (tobacco) complicating pregnancy, first trimester: Secondary | ICD-10-CM | POA: Diagnosis not present

## 2017-07-26 DIAGNOSIS — O26891 Other specified pregnancy related conditions, first trimester: Secondary | ICD-10-CM | POA: Insufficient documentation

## 2017-07-26 DIAGNOSIS — F17210 Nicotine dependence, cigarettes, uncomplicated: Secondary | ICD-10-CM | POA: Insufficient documentation

## 2017-07-26 DIAGNOSIS — Z3A01 Less than 8 weeks gestation of pregnancy: Secondary | ICD-10-CM | POA: Diagnosis not present

## 2017-07-26 DIAGNOSIS — Z3491 Encounter for supervision of normal pregnancy, unspecified, first trimester: Secondary | ICD-10-CM

## 2017-07-26 DIAGNOSIS — R109 Unspecified abdominal pain: Secondary | ICD-10-CM | POA: Diagnosis present

## 2017-07-26 DIAGNOSIS — O9989 Other specified diseases and conditions complicating pregnancy, childbirth and the puerperium: Secondary | ICD-10-CM

## 2017-07-26 DIAGNOSIS — O26899 Other specified pregnancy related conditions, unspecified trimester: Secondary | ICD-10-CM

## 2017-07-26 LAB — COMPREHENSIVE METABOLIC PANEL
ALT: 20 U/L (ref 14–54)
ANION GAP: 8 (ref 5–15)
AST: 19 U/L (ref 15–41)
Albumin: 4.4 g/dL (ref 3.5–5.0)
Alkaline Phosphatase: 71 U/L (ref 38–126)
BUN: 9 mg/dL (ref 6–20)
CHLORIDE: 105 mmol/L (ref 101–111)
CO2: 23 mmol/L (ref 22–32)
Calcium: 9.8 mg/dL (ref 8.9–10.3)
Creatinine, Ser: 0.79 mg/dL (ref 0.44–1.00)
GFR calc non Af Amer: >60 mL/min (ref >60)
Glucose, Bld: 88 mg/dL (ref 65–99)
POTASSIUM: 3.8 mmol/L (ref 3.5–5.1)
SODIUM: 136 mmol/L (ref 135–145)
Total Bilirubin: 0.7 mg/dL (ref 0.3–1.2)
Total Protein: 7.4 g/dL (ref 6.5–8.1)

## 2017-07-26 LAB — CBC
HCT: 37.2 % (ref 36.0–46.0)
Hemoglobin: 13.3 g/dL (ref 12.0–15.0)
MCH: 33.7 pg (ref 26.0–34.0)
MCHC: 35.8 g/dL (ref 30.0–36.0)
MCV: 94.2 fL (ref 78.0–100.0)
PLATELETS: 336 10*3/uL (ref 150–400)
RBC: 3.95 MIL/uL (ref 3.87–5.11)
RDW: 12.9 % (ref 11.5–15.5)
WBC: 10.9 10*3/uL — ABNORMAL HIGH (ref 4.0–10.5)

## 2017-07-26 LAB — WET PREP, GENITAL
CLUE CELLS WET PREP: NONE SEEN
Sperm: NONE SEEN
Trich, Wet Prep: NONE SEEN
Yeast Wet Prep HPF POC: NONE SEEN

## 2017-07-26 NOTE — Discharge Instructions (Signed)
Safe Medications in Pregnancy  ° °Acne: °Benzoyl Peroxide °Salicylic Acid ° °Backache/Headache: °Tylenol: 2 regular strength every 4 hours OR °             2 Extra strength every 6 hours ° °Colds/Coughs/Allergies: °Benadryl (alcohol free) 25 mg every 6 hours as needed °Breath right strips °Claritin °Cepacol throat lozenges °Chloraseptic throat spray °Cold-Eeze- up to three times per day °Cough drops, alcohol free °Flonase (by prescription only) °Guaifenesin °Mucinex °Robitussin DM (plain only, alcohol free) °Saline nasal spray/drops °Sudafed (pseudoephedrine) & Actifed ** use only after [redacted] weeks gestation and if you do not have high blood pressure °Tylenol °Vicks Vaporub °Zinc lozenges °Zyrtec  ° °Constipation: °Colace °Ducolax suppositories °Fleet enema °Glycerin suppositories °Metamucil °Milk of magnesia °Miralax °Senokot °Smooth move tea ° °Diarrhea: °Kaopectate °Imodium A-D ° °*NO pepto Bismol ° °Hemorrhoids: °Anusol °Anusol HC °Preparation H °Tucks ° °Indigestion: °Tums °Maalox °Mylanta °Zantac  °Pepcid ° °Insomnia: °Benadryl (alcohol free) 25mg every 6 hours as needed °Tylenol PM °Unisom, no Gelcaps ° °Leg Cramps: °Tums °MagGel ° °Nausea/Vomiting:  °Bonine °Dramamine °Emetrol °Ginger extract °Sea bands °Meclizine  °Nausea medication to take during pregnancy:  °Unisom (doxylamine succinate 25 mg tablets) Take one tablet daily at bedtime. If symptoms are not adequately controlled, the dose can be increased to a maximum recommended dose of two tablets daily (1/2 tablet in the morning, 1/2 tablet mid-afternoon and one at bedtime). °Vitamin B6 100mg tablets. Take one tablet twice a day (up to 200 mg per day). ° °Skin Rashes: °Aveeno products °Benadryl cream or 25mg every 6 hours as needed °Calamine Lotion °1% cortisone cream ° °Yeast infection: °Gyne-lotrimin 7 °Monistat 7 ° ° °**If taking multiple medications, please check labels to avoid duplicating the same active ingredients °**take medication as directed on  the label °** Do not exceed 4000 mg of tylenol in 24 hours °**Do not take medications that contain aspirin or ibuprofen ° ° ° °[facility]  ° ° °[facility]       Phone: [phone] ° °[facility] Phone: [phone] ° °[facility] [place]  Phone: [phone] ° °[facility] [place]  Phone: [phone] ° °[facility] [place]  Phone: [phone] ° °[facility]       Phone: [phone] ° °[facility]    Phone: [phone]  ° °[facility] ([place]    Phone: [phone] ° °[facility]    Phone: [phone] ° °[facility]    Phone: [phone] ° °[facility]    Phone: [phone] ° °[facility]       Phone: [phone]  ° °[facility]  Phone: [phone] ° °[facility]    Phone: [phone] ° °[facility]    Phone: [phone] ° °[facility]      Phone: [phone] ° °[facility]    Phone: [phone] ° ° °

## 2017-07-26 NOTE — MAU Provider Note (Signed)
History     CSN: [id number]  Arrival date and time: 07/26/17 1045   First Provider Initiated Contact with Patient 07/26/17 1115      Chief Complaint  Patient presents with  . Abdominal Pain   HPI  Ms. Regina Miller is a 21 y.o. G1P0 at [redacted]w[redacted]d gestation presenting with complaints of abdominal cramping like a regular period. She was seen at [facility] on 10/4. She is currently taking Keflex for (+) UCx. She denies VB.  Past Medical History:  Diagnosis Date  . Acid reflux   . Anxiety   . Asthma   . Attention deficit disorder   . Heart murmur    Reports she had a heart murmur as a baby that has been evaluated. She is not currently followed by a cardiologist.    Past Surgical History:  Procedure Laterality Date  . TONSILLECTOMY    . TYMPANOSTOMY TUBE PLACEMENT Bilateral     Family History  Problem Relation Age of Onset  . Psoriasis Mother   . Hypertension Father   . Eczema Father   . Diabetes Father   . Heart disease Paternal Grandmother   . Cancer Maternal Grandmother     Social History  Substance Use Topics  . Smoking status: Current Every Day Smoker    Packs/day: 1.00  . Smokeless tobacco: Never Used     Comment: Parents smoke around her  . Alcohol use Yes    Allergies:  Allergies  Allergen Reactions  . Other Hives    Nair hair removal    Prescriptions Prior to Admission  Medication Sig Dispense Refill Last Dose  . cephALEXin (KEFLEX) 500 MG capsule Take 500 mg by mouth 2 (two) times daily.   07/26/2017 at Unknown time  . Doxylamine-Pyridoxine 10-10 MG TBEC 2 tablets on an empty stomach at bedtime on day 1 and 2. If symptoms persist, take 1 tablet in the morning and 2 tablets at bedtime on day 3. If symptoms persist, increase to 1 tablet in the morning, 1 tablet in the afternoon, and 2 tablets at bedtime daily. (Patient not taking: Reported on 07/26/2017) 60 tablet 0 Not Taking at Unknown time  . Prenatal Vit-Fe Fumarate-FA (PRENATAL COMPLETE) 14-0.4 MG TABS  Take 1 tablet by mouth daily after breakfast. (Patient not taking: Reported on 07/26/2017) 30 each 2 Not Taking at Unknown time    Review of Systems  Constitutional: Negative.   HENT: Negative.   Eyes: Negative.   Respiratory: Negative.   Cardiovascular: Negative.   Gastrointestinal: Negative.   Endocrine: Negative.   Genitourinary: Positive for pelvic pain (cramping like a period).  Musculoskeletal: Negative.   Skin: Negative.   Allergic/Immunologic: Negative.   Neurological: Negative.   Hematological: Negative.   Psychiatric/Behavioral: Negative.    Physical Exam   Weight 90.5 kg (199 lb 8 oz), last menstrual period 06/12/2017.  Physical Exam  Nursing note and vitals reviewed. Constitutional: She is oriented to person, place, and time. She appears well-developed and well-nourished.  HENT:  Head: Normocephalic.  Eyes: Pupils are equal, round, and reactive to light.  Neck: Normal range of motion.  Cardiovascular: Normal rate, regular rhythm and normal heart sounds.   Respiratory: Effort normal and breath sounds normal.  GI: Soft. Bowel sounds are normal.  Musculoskeletal: Normal range of motion.  Neurological: She is alert and oriented to person, place, and time.  Skin: Skin is warm and dry.  Psychiatric: She has a normal mood and affect. Her behavior is normal. Judgment and thought  content normal.    MAU Course  Procedures  MDM CBC CMP Wet Prep  GC/CT OB U/S <14 wks OB U/S Transvaginal  Results for orders placed or performed during the hospital encounter of 07/26/17 (from the past 24 hour(s))  Wet prep, genital     Status: Abnormal   Collection Time: 07/26/17 11:20 AM  Result Value Ref Range   Yeast Wet Prep HPF POC NONE SEEN NONE SEEN   Trich, Wet Prep NONE SEEN NONE SEEN   Clue Cells Wet Prep HPF POC NONE SEEN NONE SEEN   WBC, Wet Prep HPF POC FEW (A) NONE SEEN   Sperm NONE SEEN   CBC     Status: Abnormal   Collection Time: 07/26/17 11:21 AM  Result  Value Ref Range   WBC 10.9 (H) 4.0 - 10.5 K/uL   RBC 3.95 3.87 - 5.11 MIL/uL   Hemoglobin 13.3 12.0 - 15.0 g/dL   HCT 16.1 09.6 - 04.5 %   MCV 94.2 78.0 - 100.0 fL   MCH 33.7 26.0 - 34.0 pg   MCHC 35.8 30.0 - 36.0 g/dL   RDW 40.9 81.1 - 91.4 %   Platelets 336 150 - 400 K/uL  Comprehensive metabolic panel     Status: None   Collection Time: 07/26/17 11:21 AM  Result Value Ref Range   Sodium 136 135 - 145 mmol/L   Potassium 3.8 3.5 - 5.1 mmol/L   Chloride 105 101 - 111 mmol/L   CO2 23 22 - 32 mmol/L   Glucose, Bld 88 65 - 99 mg/dL   BUN 9 6 - 20 mg/dL   Creatinine, Ser 7.82 0.44 - 1.00 mg/dL   Calcium 9.8 8.9 - 95.6 mg/dL   Total Protein 7.4 6.5 - 8.1 g/dL   Albumin 4.4 3.5 - 5.0 g/dL   AST 19 15 - 41 U/L   ALT 20 14 - 54 U/L   Alkaline Phosphatase 71 38 - 126 U/L   Total Bilirubin 0.7 0.3 - 1.2 mg/dL   GFR calc non Af Amer >60 >60 mL/min   GFR calc Af Amer >60 >60 mL/min   Anion gap 8 5 - 15    US Ob Comp Less 14 Wks  Result Date: 07/26/2017 CLINICAL DATA:  21 year old female reportedly [redacted] weeks pregnant with abdominal cramping. EXAM: OBSTETRIC <14 WK [place] AND TRANSVAGINAL OB US TECHNIQUE: Both transabdominal and transvaginal ultrasound examinations were performed for complete evaluation of the gestation as well as the maternal uterus, adnexal regions, and pelvic cul-de-sac. Transvaginal technique was performed to assess early pregnancy. COMPARISON:  None. FINDINGS: Intrauterine gestational sac: Single Yolk sac:  Visualized. Embryo:  Visualized. Cardiac Activity: Visualized. Heart Rate: 123  bpm CRL:  4.6  mm   6 w   1 d                  [place] EDC: March 20, 2018 Subchorionic hemorrhage:  None visualized. Maternal uterus/adnexae: Normal sonographic appearance of the ovaries. The corpus luteal cyst is in the right ovary. No free fluid. IMPRESSION: Single viable intrauterine pregnancy. The fetal heart rate is 123 beats per minute. By crown-rump length, the estimated gestational age is 6 weeks  1 day and the estimated date of confinement is March 20, 2018. Electronically Signed   By: Malachy Moan M.D.   On: 07/26/2017 13:08    Assessment and Plan  First trimester pregnancy - Get OB care established with OB/GYN provider of your choice - You can take  OTC PNV with DHA  Abdominal cramping affecting pregnancy - Reassured no abnormal results today - Advised that abdominal cramping could be result of (+) UTI - Advised to continue taking Keflex as prescribed - Establish [facility] [place] ASAP -- list given - Return to MAU for OB/GYN emergencies  Discharge home Patient verbalized an understanding of the plan of care and agrees.   Raelyn Mora, MSN, CNM 07/26/2017, 11:15 AM

## 2017-07-26 NOTE — MAU Note (Signed)
Cramping like a regular period.  Is taking antibiotics for +urine culture. No bleeding.

## 2017-07-27 LAB — GC/CHLAMYDIA PROBE AMP (~~LOC~~) NOT AT ARMC
Chlamydia: POSITIVE — AB
NEISSERIA GONORRHEA: NEGATIVE

## 2017-07-27 LAB — HIV ANTIBODY (ROUTINE TESTING W REFLEX): HIV SCREEN 4TH GENERATION: NONREACTIVE

## 2017-07-28 ENCOUNTER — Telehealth: Payer: Self-pay | Admitting: Medical

## 2017-07-28 DIAGNOSIS — A749 Chlamydial infection, unspecified: Secondary | ICD-10-CM

## 2017-07-28 DIAGNOSIS — O98811 Other maternal infectious and parasitic diseases complicating pregnancy, first trimester: Principal | ICD-10-CM

## 2017-07-28 MED ORDER — AZITHROMYCIN 250 MG PO TABS: 1000.0000 mg | ORAL_TABLET | Freq: Once | ORAL | 0 refills | Status: AC

## 2017-07-28 NOTE — Telephone Encounter (Addendum)
Regina Miller tested positive for  Chlamydia. Patient was called by RN and allergies and pharmacy confirmed. Rx sent to pharmacy of choice.   Marny Lowenstein, PA-C 07/28/2017 11:39 AM      ----- Message from Kathe Becton, RN sent at 07/28/2017 10:41 AM EDT ----- This patient tested positive for "Chlamydia"  She :"has NKDA", (Allergic to [facility] Hair Removal).I have informed the patient of her results and confirmed her pharmacy is correct in her chart. Please send Rx.   Thank you,   Kathe Becton, RN   Results faxed to [facility] Department.

## 2017-07-29 ENCOUNTER — Other Ambulatory Visit: Payer: Self-pay | Admitting: Medical

## 2017-07-29 DIAGNOSIS — O98811 Other maternal infectious and parasitic diseases complicating pregnancy, first trimester: Principal | ICD-10-CM

## 2017-07-29 DIAGNOSIS — A749 Chlamydial infection, unspecified: Secondary | ICD-10-CM

## 2017-10-01 ENCOUNTER — Encounter (HOSPITAL_COMMUNITY): Payer: Self-pay | Admitting: Emergency Medicine

## 2017-10-01 ENCOUNTER — Emergency Department (HOSPITAL_COMMUNITY)
Admission: EM | Admit: 2017-10-01 | Discharge: 2017-10-02 | Disposition: A | Discharge status: Home / Self Care | Payer: Medicaid Other | Attending: Emergency Medicine | Admitting: Emergency Medicine

## 2017-10-01 ENCOUNTER — Other Ambulatory Visit: Payer: Self-pay

## 2017-10-01 DIAGNOSIS — M545 Low back pain, unspecified: Secondary | ICD-10-CM

## 2017-10-01 DIAGNOSIS — N939 Abnormal uterine and vaginal bleeding, unspecified: Secondary | ICD-10-CM | POA: Diagnosis present

## 2017-10-01 DIAGNOSIS — F909 Attention-deficit hyperactivity disorder, unspecified type: Secondary | ICD-10-CM | POA: Insufficient documentation

## 2017-10-01 DIAGNOSIS — F17200 Nicotine dependence, unspecified, uncomplicated: Secondary | ICD-10-CM | POA: Diagnosis not present

## 2017-10-01 DIAGNOSIS — J45909 Unspecified asthma, uncomplicated: Secondary | ICD-10-CM | POA: Insufficient documentation

## 2017-10-01 DIAGNOSIS — F419 Anxiety disorder, unspecified: Secondary | ICD-10-CM | POA: Diagnosis not present

## 2017-10-01 LAB — URINALYSIS, ROUTINE W REFLEX MICROSCOPIC
Bilirubin Urine: NEGATIVE
GLUCOSE, UA: NEGATIVE mg/dL
Ketones, ur: NEGATIVE mg/dL
Leukocytes, UA: NEGATIVE
Nitrite: NEGATIVE
PH: 7 (ref 5.0–8.0)
Protein, ur: NEGATIVE mg/dL
Specific Gravity, Urine: 1.027 (ref 1.005–1.030)

## 2017-10-01 LAB — CBC WITH DIFFERENTIAL/PLATELET
BASOS PCT: 0 %
Basophils Absolute: 0 10*3/uL (ref 0.0–0.1)
EOS ABS: 0.5 10*3/uL (ref 0.0–0.7)
Eosinophils Relative: 4 %
HCT: 40.3 % (ref 36.0–46.0)
Hemoglobin: 13.5 g/dL (ref 12.0–15.0)
LYMPHS ABS: 4.8 10*3/uL — AB (ref 0.7–4.0)
LYMPHS PCT: 38 %
MCH: 32.2 pg (ref 26.0–34.0)
MCHC: 33.5 g/dL (ref 30.0–36.0)
MCV: 96.2 fL (ref 78.0–100.0)
Monocytes Absolute: 0.8 10*3/uL (ref 0.1–1.0)
Monocytes Relative: 7 %
NEUTROS PCT: 51 %
Neutro Abs: 6.4 10*3/uL (ref 1.7–7.7)
PLATELETS: 365 10*3/uL (ref 150–400)
RBC: 4.19 MIL/uL (ref 3.87–5.11)
RDW: 12.6 % (ref 11.5–15.5)
WBC: 12.5 10*3/uL — AB (ref 4.0–10.5)

## 2017-10-01 LAB — BASIC METABOLIC PANEL
Anion gap: 7 (ref 5–15)
BUN: 8 mg/dL (ref 6–20)
CO2: 27 mmol/L (ref 22–32)
CREATININE: 0.98 mg/dL (ref 0.44–1.00)
Calcium: 9.3 mg/dL (ref 8.9–10.3)
Chloride: 105 mmol/L (ref 101–111)
Glucose, Bld: 85 mg/dL (ref 65–99)
POTASSIUM: 3.8 mmol/L (ref 3.5–5.1)
SODIUM: 139 mmol/L (ref 135–145)

## 2017-10-01 LAB — I-STAT BETA HCG BLOOD, ED (MC, WL, AP ONLY): I-stat hCG, quantitative: <5 m[IU]/mL (ref <5)

## 2017-10-01 NOTE — ED Notes (Signed)
Pelvic cart at bedside. 

## 2017-10-01 NOTE — ED Triage Notes (Signed)
Pt states she had a miscarriage last Oct, she is bleeding since then now she has some brownish secretion coming out her urine is very cloudy and she has intermittent 9/10 lower back pain. No fever or chills, denies any burning sensation with urination.

## 2017-10-01 NOTE — ED Provider Notes (Signed)
[facility] EMERGENCY [facility] Provider Note   CSN: [id number] Arrival date & time: 10/01/17  1911     History   Chief Complaint Chief Complaint  Patient presents with  . Back Pain    HPI Regina Miller is a 21 y.o. female.  HPI Patient reports she has had vaginal spotting since an induced abortion late October.  She reports spotting is been brown with occasional clots.  She has not had much cramping or pain since completing the procedure.  She does have some low back pain aching in quality.  No pain or burning with urination.  No fever no chills no nausea no vomiting.  Patient reports she just began sexual activity 2 days ago.  She denies pain with sexual activity. Past Medical History:  Diagnosis Date  . Acid reflux   . Anxiety   . Asthma   . Attention deficit disorder   . Heart murmur    Reports she had a heart murmur as a baby that has been evaluated. She is not currently followed by a cardiologist.    Patient Active Problem List   Diagnosis Date Noted  . First trimester pregnancy 07/26/2017  . Anxiety 12/22/2013  . Eczema 12/22/2013  . Body mass index, pediatric, greater than or 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 age 74/02/2014  . Acne 04/26/2012  . Insomnia 07/06/2011  . GERD 09/19/2008  . ATTENTION DEFICIT, W/HYPERACTIVITY 12/17/2006    Past Surgical History:  Procedure Laterality Date  . TONSILLECTOMY    . TYMPANOSTOMY TUBE PLACEMENT Bilateral     OB History    Gravida Para Term Preterm AB Living   1             SAB TAB Ectopic Multiple Live Births                   Home Medications    Prior to Admission medications   Medication Sig Start Date End Date Taking? Authorizing Provider  Doxylamine-Pyridoxine 10-10 MG TBEC 2 tablets on an empty stomach at bedtime on day 1 and 2. If symptoms persist, take 1 tablet in the morning and 2 tablets at bedtime on day 3. If symptoms persist, increase to 1 tablet in the morning, 1 tablet in the  afternoon, and 2 tablets at bedtime daily. Patient not taking: Reported on 07/26/2017 07/23/17   Street, [place], PA-C  ibuprofen (ADVIL,MOTRIN) 600 MG tablet Take 1 tablet (600 mg total) by mouth every 6 (six) hours as needed. 10/02/17   Arby BarrettePfeiffer, Swanson Farnell, MD  Prenatal Vit-Fe Fumarate-FA (PRENATAL COMPLETE) 14-0.4 MG TABS Take 1 tablet by mouth daily after breakfast. Patient not taking: Reported on 07/26/2017 07/23/17   Street, PetreyMercedes, PA-C    Family History Family History  Problem Relation Age of Onset  . Psoriasis Mother   . Hypertension Father   . Eczema Father   . Diabetes Father   . Heart disease Paternal Grandmother   . Cancer Maternal Grandmother     Social History Social History   Tobacco Use  . Smoking status: Current Every Day Smoker    Packs/day: 1.00  . Smokeless tobacco: Never Used  . Tobacco comment: Parents smoke around her  Substance Use Topics  . Alcohol use: Yes  . Drug use: Yes    Types: Marijuana     Allergies   Other   Review of Systems Review of Systems 10 Systems reviewed and are negative for acute change except as noted in the HPI.  Physical Exam Updated Vital Signs BP 111/61 (BP Location: Right Arm)   Pulse 88   Temp 99 F (37.2 C) (Oral)   Resp 18   Ht 5\' 3"  (1.6 m)   Wt 90.3 kg (199 lb)   LMP 08/12/2017   SpO2 100%   Breastfeeding? Unknown   BMI 35.25 kg/m   Physical Exam  Constitutional: She is oriented to person, place, and time. She appears well-developed and well-nourished. No distress.  HENT:  Head: Normocephalic and atraumatic.  Eyes: Conjunctivae are normal.  Neck: Neck supple.  Cardiovascular: Normal rate and regular rhythm.  No murmur heard. Pulmonary/Chest: Effort normal and breath sounds normal. No respiratory distress.  Abdominal: Soft. She exhibits no distension. There is no tenderness. There is no guarding.  Genitourinary:  Genitourinary Comments: Normal external female genitalia.  Back and exam modest amount  of blood in the vaginal vault.  No clots.  Bimanual nontender.  Musculoskeletal: She exhibits no edema or tenderness.  Neurological: She is alert and oriented to person, place, and time. No cranial nerve deficit. She exhibits normal muscle tone. Coordination normal.  Skin: Skin is warm and dry.  Psychiatric: She has a normal mood and affect.  Nursing note and vitals reviewed.    ED Treatments / Results  Labs (all labs ordered are listed, but only abnormal results are displayed) Labs Reviewed  URINALYSIS, ROUTINE W REFLEX MICROSCOPIC - Abnormal; Notable for the following components:      Result Value   APPearance HAZY (*)    Hgb urine dipstick LARGE (*)    Bacteria, UA RARE (*)    Squamous Epithelial / LPF 0-5 (*)    All other components within normal limits  CBC WITH DIFFERENTIAL/PLATELET - Abnormal; Notable for the following components:   WBC 12.5 (*)    Lymphs Abs 4.8 (*)    All other components within normal limits  WET PREP, GENITAL  BASIC METABOLIC PANEL  I-STAT BETA HCG BLOOD, ED ([facility], [facility], [facility] ONLY)  GC/CHLAMYDIA PROBE AMP ([place]) NOT AT [facility]    EKG  EKG Interpretation None       Radiology No results found.  Procedures Procedures (including critical care time)  Medications Ordered in ED Medications - No data to display   Initial Impression / Assessment and Plan / ED Course  I have reviewed the triage vital signs and the nursing notes.  Pertinent labs & imaging results that were available during my care of the patient were reviewed by me and considered in my medical decision making (see chart for details).      Final Clinical Impressions(s) / ED Diagnoses   Final diagnoses:  Acute midline low back pain without sciatica  Vaginal bleeding  Pregnancy test is negative.  Patient reports spotting for several months.  At this time no signs of PID.  Bimanual is nontender.  Will not empirically treat with antibiotics.  Patient is counseled to follow-up on  GC chlamydia results in my chart.  Patient is counseled to follow-up with GYN care for irregular menstrual bleeding and evaluation for birth control.  She is well in appearance with no distress up and ambulatory in coordinated nonantalgic gait.  ED Discharge Orders        Ordered    ibuprofen (ADVIL,MOTRIN) 600 MG tablet  Every 6 hours PRN     10/02/17 0003       Arby BarrettePfeiffer, Tsutomu Barfoot, MD 10/02/17 0008

## 2017-10-02 LAB — WET PREP, GENITAL
CLUE CELLS WET PREP: NONE SEEN
Sperm: NONE SEEN
TRICH WET PREP: NONE SEEN
YEAST WET PREP: NONE SEEN

## 2017-10-02 LAB — GC/CHLAMYDIA PROBE AMP (~~LOC~~) NOT AT ARMC
Chlamydia: NEGATIVE
NEISSERIA GONORRHEA: NEGATIVE

## 2017-10-02 MED ORDER — IBUPROFEN 600 MG PO TABS: 600.0000 mg | ORAL_TABLET | Freq: Four times a day (QID) | ORAL | 0 refills | Status: DC | PRN

## 2017-10-02 NOTE — ED Notes (Signed)
Pt stable and ambulatory for discharge, states understanding follow up and medications

## 2018-06-02 ENCOUNTER — Encounter (HOSPITAL_COMMUNITY): Payer: Self-pay | Admitting: Emergency Medicine

## 2018-06-02 ENCOUNTER — Emergency Department (HOSPITAL_COMMUNITY)
Admission: EM | Admit: 2018-06-02 | Discharge: 2018-06-02 | Disposition: A | Discharge status: Home / Self Care | Payer: Medicaid Other | Attending: Emergency Medicine | Admitting: Emergency Medicine

## 2018-06-02 DIAGNOSIS — R350 Frequency of micturition: Secondary | ICD-10-CM | POA: Diagnosis not present

## 2018-06-02 DIAGNOSIS — Z87891 Personal history of nicotine dependence: Secondary | ICD-10-CM | POA: Diagnosis not present

## 2018-06-02 DIAGNOSIS — R197 Diarrhea, unspecified: Secondary | ICD-10-CM | POA: Insufficient documentation

## 2018-06-02 DIAGNOSIS — F1290 Cannabis use, unspecified, uncomplicated: Secondary | ICD-10-CM | POA: Diagnosis not present

## 2018-06-02 LAB — COMPREHENSIVE METABOLIC PANEL
ALBUMIN: 4 g/dL (ref 3.5–5.0)
ALT: 22 U/L (ref 0–44)
ANION GAP: 11 (ref 5–15)
AST: 19 U/L (ref 15–41)
Alkaline Phosphatase: 74 U/L (ref 38–126)
BILIRUBIN TOTAL: 0.3 mg/dL (ref 0.3–1.2)
BUN: 9 mg/dL (ref 6–20)
CHLORIDE: 102 mmol/L (ref 98–111)
CO2: 24 mmol/L (ref 22–32)
Calcium: 8.9 mg/dL (ref 8.9–10.3)
Creatinine, Ser: 1 mg/dL (ref 0.44–1.00)
GFR calc Af Amer: >60 mL/min (ref >60)
GFR calc non Af Amer: >60 mL/min (ref >60)
GLUCOSE: 101 mg/dL — AB (ref 70–99)
POTASSIUM: 3.7 mmol/L (ref 3.5–5.1)
SODIUM: 137 mmol/L (ref 135–145)
TOTAL PROTEIN: 7.2 g/dL (ref 6.5–8.1)

## 2018-06-02 LAB — URINALYSIS, ROUTINE W REFLEX MICROSCOPIC
BILIRUBIN URINE: NEGATIVE
Glucose, UA: NEGATIVE mg/dL
Ketones, ur: NEGATIVE mg/dL
Leukocytes, UA: NEGATIVE
NITRITE: NEGATIVE
PROTEIN: NEGATIVE mg/dL
Specific Gravity, Urine: 1.005 (ref 1.005–1.030)
pH: 6 (ref 5.0–8.0)

## 2018-06-02 LAB — CBC
HEMATOCRIT: 38.7 % (ref 36.0–46.0)
HEMOGLOBIN: 13.4 g/dL (ref 12.0–15.0)
MCH: 32.5 pg (ref 26.0–34.0)
MCHC: 34.6 g/dL (ref 30.0–36.0)
MCV: 93.9 fL (ref 78.0–100.0)
Platelets: 362 10*3/uL (ref 150–400)
RBC: 4.12 MIL/uL (ref 3.87–5.11)
RDW: 12.5 % (ref 11.5–15.5)
WBC: 12.6 10*3/uL — ABNORMAL HIGH (ref 4.0–10.5)

## 2018-06-02 LAB — LIPASE, BLOOD: LIPASE: 24 U/L (ref 11–51)

## 2018-06-02 LAB — I-STAT BETA HCG BLOOD, ED (MC, WL, AP ONLY)

## 2018-06-02 MED ORDER — DICYCLOMINE HCL 20 MG PO TABS: 20.0000 mg | ORAL_TABLET | Freq: Two times a day (BID) | ORAL | 0 refills | Status: DC

## 2018-06-02 MED ORDER — SODIUM CHLORIDE 0.9 % IV BOLUS
1000.0000 mL | Freq: Once | INTRAVENOUS | Status: AC
  Administered 2018-06-02: 1000 mL via INTRAVENOUS

## 2018-06-02 MED ORDER — DICYCLOMINE HCL 10 MG/ML IM SOLN
20.0000 mg | Freq: Once | INTRAMUSCULAR | Status: AC
  Administered 2018-06-02: 20 mg via INTRAMUSCULAR
  Filled 2018-06-02: qty 2

## 2018-06-02 NOTE — ED Triage Notes (Signed)
Pt reports started her period on Sunday and been very light flow. For 2-3 days hasnt felt like herself: diarrhea, eyes feel tired urinary frequency.

## 2018-06-02 NOTE — ED Provider Notes (Signed)
[facility]-EMERGENCY DEPT Provider Note   CSN: [id number] Arrival date & time: 06/02/18  1227     History   Chief Complaint Chief Complaint  Patient presents with  . Diarrhea    HPI Regina PORCELLI is a 22 y.o. female with past medical history of GERD, asthma, anxiety who presents emergency department today for diarrhea.  Patient reports approximately 24-48 hours ago she developed loose stools that occur approximately 3-4 times per day.  She denies any gross diarrhea, melena or hematochezia. She has not tried anything for her symptoms. Nothing makes her symptoms better or worse. She denies any associated fever, nausea, vomiting, flank pain or abdominal pain.  She does report some urinary frequency with this but no dysuria, hematuria, urinary urgency.  She states she is currently on her menstrual cycle.  She denies any vaginal discharge otherwise or pelvic pain.  Patient does not take anything for symptoms.  She notes she did eat out at a [employer] and thinks her symptoms may be related to this.  She denies any recent antibiotic use, travel or sick contacts.  No family history of personal history of IBD.  No previous abdominal surgeries.   HPI  Past Medical History:  Diagnosis Date  . Acid reflux   . Anxiety   . Asthma   . Attention deficit disorder   . Heart murmur    Reports she had a heart murmur as a baby that has been evaluated. She is not currently followed by a cardiologist.    Patient Active Problem List   Diagnosis Date Noted  . First trimester pregnancy 07/26/2017  . Anxiety 12/22/2013  . Eczema 12/22/2013  . Body mass index, pediatric, greater than or 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 age 59/02/2014  . Acne 04/26/2012  . Insomnia 07/06/2011  . GERD 09/19/2008  . ATTENTION DEFICIT, W/HYPERACTIVITY 12/17/2006    Past Surgical History:  Procedure Laterality Date  . TONSILLECTOMY    . TYMPANOSTOMY TUBE PLACEMENT Bilateral      OB  History    Gravida  1   Para      Term      Preterm      AB      Living        SAB      TAB      Ectopic      Multiple      Live Births               Home Medications    Prior to Admission medications   Medication Sig Start Date End Date Taking? Authorizing Provider  Doxylamine-Pyridoxine 10-10 MG TBEC 2 tablets on an empty stomach at bedtime on day 1 and 2. If symptoms persist, take 1 tablet in the morning and 2 tablets at bedtime on day 3. If symptoms persist, increase to 1 tablet in the morning, 1 tablet in the afternoon, and 2 tablets at bedtime daily. Patient not taking: Reported on 07/26/2017 07/23/17   Street, Olga, PA-C  ibuprofen (ADVIL,MOTRIN) 600 MG tablet Take 1 tablet (600 mg total) by mouth every 6 (six) hours as needed. 10/02/17   Arby Barrette, MD  Prenatal Vit-Fe Fumarate-FA (PRENATAL COMPLETE) 14-0.4 MG TABS Take 1 tablet by mouth daily after breakfast. Patient not taking: Reported on 07/26/2017 07/23/17   Street, Cornelius, PA-C    Family History Family History  Problem Relation Age of Onset  . Psoriasis Mother   . Hypertension Father   .  Eczema Father   . Diabetes Father   . Heart disease Paternal Grandmother   . Cancer Maternal Grandmother     Social History Social History   Tobacco Use  . Smoking status: Former Smoker    Packs/day: 1.00  . Smokeless tobacco: Never Used  . Tobacco comment: Parents smoke around her  Substance Use Topics  . Alcohol use: Yes  . Drug use: Yes    Types: Marijuana     Allergies   Other   Review of Systems Review of Systems  All other systems reviewed and are negative.    Physical Exam Updated Vital Signs BP 103/68   Pulse 98   Temp 99 F (37.2 C) (Oral)   Resp 18   LMP 05/30/2018   SpO2 100%   Physical Exam  Constitutional: She appears well-developed and well-nourished.  HENT:  Head: Normocephalic and atraumatic.  Right Ear: External ear normal.  Left Ear: External ear normal.   Nose: Nose normal.  Mouth/Throat: Uvula is midline, oropharynx is clear and moist and mucous membranes are normal. No tonsillar exudate.  Eyes: Pupils are equal, round, and reactive to light. Right eye exhibits no discharge. Left eye exhibits no discharge. No scleral icterus.  Neck: Trachea normal. Neck supple. No spinous process tenderness present. No neck rigidity. Normal range of motion present.  Cardiovascular: Normal rate, regular rhythm and intact distal pulses.  No murmur heard. Pulses:      Radial pulses are 2+ on the right side, and 2+ on the left side.       Dorsalis pedis pulses are 2+ on the right side, and 2+ on the left side.       Posterior tibial pulses are 2+ on the right side, and 2+ on the left side.  No lower extremity swelling or edema. Calves symmetric in size bilaterally.  Pulmonary/Chest: Effort normal and breath sounds normal. She exhibits no tenderness.  Abdominal: Soft. Bowel sounds are normal. She exhibits no distension. There is no tenderness. There is no rigidity, no rebound, no guarding, no CVA tenderness, no tenderness at McBurney's point and negative Murphy's sign.  Musculoskeletal: She exhibits no edema.  Lymphadenopathy:    She has no cervical adenopathy.  Neurological: She is alert.  Skin: Skin is warm and dry. No rash noted. She is not diaphoretic.  Psychiatric: She has a normal mood and affect.  Nursing note and vitals reviewed.    ED Treatments / Results  Labs (all labs ordered are listed, but only abnormal results are displayed) Labs Reviewed  COMPREHENSIVE METABOLIC PANEL - Abnormal; Notable for the following components:      Result Value   Glucose, Bld 101 (*)    All other components within normal limits  CBC - Abnormal; Notable for the following components:   WBC 12.6 (*)    All other components within normal limits  URINALYSIS, ROUTINE W REFLEX MICROSCOPIC - Abnormal; Notable for the following components:   Color, Urine STRAW (*)     Hgb urine dipstick LARGE (*)    Bacteria, UA RARE (*)    All other components within normal limits  LIPASE, BLOOD  I-STAT BETA HCG BLOOD, ED ([facility], [facility], [facility] ONLY)    EKG None  Radiology No results found.  Procedures Procedures (including critical care time)  Medications Ordered in ED Medications  sodium chloride 0.9 % bolus 1,000 mL (1,000 mLs Intravenous New Bag/Given 06/02/18 1422)  dicyclomine (BENTYL) injection 20 mg (20 mg Intramuscular Given 06/02/18 1422)  sodium chloride 0.9 % bolus 1,000 mL (1,000 mLs Intravenous New Bag/Given 06/02/18 1446)     Initial Impression / Assessment and Plan / ED Course  I have reviewed the triage vital signs and the nursing notes.  Pertinent labs & imaging results that were available during my care of the patient were reviewed by me and considered in my medical decision making (see chart for details).     22 y.o. female presenting with diarrhea and urinary frequency.  Patient denies any fever, abdominal pain, melena, hematochezia, recent travel, antibiotic use.  Abdominal exam is without any tenderness.  Patient is nontoxic, nonseptic appearing, in no apparent distress.  Patient's pain and other symptoms adequately managed in emergency department.  Fluid bolus given.  Labs, imaging and vitals reviewed.  Patient does not meet the SIRS or Sepsis criteria.  On repeat exam patient does not have a surgical abdomin and there are no peritoneal signs.  No indication of appendicitis, bowel obstruction, bowel perforation, cholecystitis, diverticulitis, ectopic pregnancy.  Patient discharged home with symptomatic treatment and given strict instructions for follow-up with their primary care physician.  I have also discussed reasons to return immediately to the ER.  Patient expresses understanding and agrees with plan.  Final Clinical Impressions(s) / ED Diagnoses   Final diagnoses:  Diarrhea, unspecified type    ED Discharge Orders         Ordered     dicyclomine (BENTYL) 20 MG tablet  2 times daily     06/02/18 1640           Princella PellegriniMaczis, Antanasia Kaczynski M, PA-C 06/02/18 1647    Gwyneth SproutPlunkett, Whitney, MD 06/03/18 [phone]

## 2018-06-02 NOTE — Discharge Instructions (Signed)
Please read and follow all provided instructions.  You were seen here today for diarrhea  Tests performed today include: Blood Work  Vital signs. See below for your results today.   Home Care Instructions Continue to hydrate orally. Read the instructions below for reasons to return to the ER. Do not take your medicine if develop an itchy rash, swelling in your mouth or lips, or difficulty breathing  The 'BRAT' diet is suggested, then progress to diet as tolerated as symptoms abate. Call if bloody stools, persistent diarrhea, vomiting, fever or abdominal pain. Bananas.  Rice.  Applesauce.  Toast (and other simple starches such as crackers, potatoes, noodles).   Follow any educational materials contained in this packet.  Follow-up instructions: Please follow-up with your primary care provider for further evaluation of symptoms and treatment this week.    Return instructions:  Please return to the Emergency Department if you do not get better, if you get worse, or new symptoms OR You begin having localized abdominal pain that does not go away or becomes severe (The right side could  possibly be appendicitis. In an adult, the left lower portion of the abdomen could be colitis or diverticulitis) A temperature above 101 develops Repeated vomiting occurs (multiple uncontrollable episodes) or you are unable to keep fluids down Blood is being passed in stools or vomit (bright red or black tarry stools).  Return also if you develop chest pain, difficulty breathing, dizziness or fainting, or become confused, poorly responsive, or inconsolable (young children). Please return if you have any other emergent concerns.  Additional Information:  Your vital signs today were: BP 103/68    Pulse 98    Temp 99 F (37.2 C) (Oral)    Resp 18    LMP 05/30/2018    SpO2 100%  If your blood pressure (BP) was elevated above 135/85 this visit, please have this repeated by your doctor within one  month. ---------------

## 2018-06-04 ENCOUNTER — Ambulatory Visit (HOSPITAL_COMMUNITY)
Admission: EM | Admit: 2018-06-04 | Discharge: 2018-06-04 | Disposition: A | Discharge status: Home / Self Care | Payer: Medicaid Other | Attending: Family Medicine | Admitting: Family Medicine

## 2018-06-04 ENCOUNTER — Encounter (HOSPITAL_COMMUNITY): Payer: Self-pay | Admitting: Emergency Medicine

## 2018-06-04 DIAGNOSIS — M436 Torticollis: Secondary | ICD-10-CM

## 2018-06-04 MED ORDER — NAPROXEN 500 MG PO TABS: 500.0000 mg | ORAL_TABLET | Freq: Two times a day (BID) | ORAL | 0 refills | Status: DC

## 2018-06-04 MED ORDER — CYCLOBENZAPRINE HCL 10 MG PO TABS: 5.0000 mg | ORAL_TABLET | Freq: Every day | ORAL | 0 refills | Status: DC

## 2018-06-04 NOTE — ED Triage Notes (Signed)
Pt c/o neck pain x3 days, states it gives her a headache. Pt states "I think I might need a muscle relaxer."

## 2018-06-04 NOTE — Discharge Instructions (Signed)
It was nice meeting you!!  I believe that you have strained your neck muscles.  We will give you a low dose muscle relaxant and anti inflammatory  Heat and massage to the area could help.

## 2018-06-04 NOTE — ED Provider Notes (Signed)
[facility]    CSN: [id number] Arrival date & time: 06/04/18  1244     History   Chief Complaint Chief Complaint  Patient presents with  . Neck Pain    HPI Regina Miller is a 22 y.o. female.   Pt is a 22 year old female with PMH, anxiety, asthma, and ADD. She presents with 3 days of neck pain. This is in the paravertebral muscles of the cervical spine. It has been constant and worse with turning her neck to the left. She has been sleeping on a friends couch with not much neck support.  She has been taking ibuprofen without relief of pain.  she denies any injury to the neck.  She denies any fever, chills, night sweats, fatigue or body aches.  She denies any dizziness, headaches.   ROS per HPI      Past Medical History:  Diagnosis Date  . Acid reflux   . Anxiety   . Asthma   . Attention deficit disorder   . Heart murmur    Reports she had a heart murmur as a baby that has been evaluated. She is not currently followed by a cardiologist.    Patient Active Problem List   Diagnosis Date Noted  . First trimester pregnancy 07/26/2017  . Anxiety 12/22/2013  . Eczema 12/22/2013  . Body mass index, pediatric, greater than or 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 age 75/02/2014  . Acne 04/26/2012  . Insomnia 07/06/2011  . GERD 09/19/2008  . ATTENTION DEFICIT, W/HYPERACTIVITY 12/17/2006    Past Surgical History:  Procedure Laterality Date  . TONSILLECTOMY    . TYMPANOSTOMY TUBE PLACEMENT Bilateral     OB History    Gravida  1   Para      Term      Preterm      AB      Living        SAB      TAB      Ectopic      Multiple      Live Births               Home Medications    Prior to Admission medications   Medication Sig Start Date End Date Taking? Authorizing Provider  cyclobenzaprine (FLEXERIL) 10 MG tablet Take 0.5 tablets (5 mg total) by mouth at bedtime. 06/04/18   Dahlia ByesBast, Keiosha Cancro A, NP  dicyclomine (BENTYL) 20 MG tablet Take 1 tablet  (20 mg total) by mouth 2 (two) times daily. 06/02/18   Maczis, Elmer SowMichael M, PA-C  Doxylamine-Pyridoxine 10-10 MG TBEC 2 tablets on an empty stomach at bedtime on day 1 and 2. If symptoms persist, take 1 tablet in the morning and 2 tablets at bedtime on day 3. If symptoms persist, increase to 1 tablet in the morning, 1 tablet in the afternoon, and 2 tablets at bedtime daily. Patient not taking: Reported on 06/02/2018 07/23/17   Street, [place] Pines[place]cedes, PA-C  ibuprofen (ADVIL,MOTRIN) 600 MG tablet Take 1 tablet (600 mg total) by mouth every 6 (six) hours as needed. Patient not taking: Reported on 06/02/2018 10/02/17   Arby BarrettePfeiffer, Marcy, MD  naproxen (NAPROSYN) 500 MG tablet Take 1 tablet (500 mg total) by mouth 2 (two) times daily. 06/04/18   Janace ArisBast, Jayleigh Notarianni A, NP  Prenatal Vit-Fe Fumarate-FA (PRENATAL COMPLETE) 14-0.4 MG TABS Take 1 tablet by mouth daily after breakfast. Patient not taking: Reported on 07/26/2017 07/23/17   Street, [place]cedes, PA-C    Family History Family  History  Problem Relation Age of Onset  . Psoriasis Mother   . Hypertension Father   . Eczema Father   . Diabetes Father   . Heart disease Paternal Grandmother   . Cancer Maternal Grandmother     Social History Social History   Tobacco Use  . Smoking status: Former Smoker    Packs/day: 1.00  . Smokeless tobacco: Never Used  . Tobacco comment: Parents smoke around her  Substance Use Topics  . Alcohol use: Yes  . Drug use: Yes    Types: Marijuana     Allergies   Other   Review of Systems Review of Systems   Physical Exam Triage Vital Signs ED Triage Vitals [06/04/18 1310]  Enc Vitals Group     BP 107/66     Pulse Rate 99     Resp 18     Temp 98.6 F (37 C)     Temp src      SpO2 100 %     Weight      Height      Head Circumference      Peak Flow      Pain Score 8     Pain Loc      Pain Edu?      Excl. in GC?    No data found.  Updated Vital Signs BP 107/66   Pulse 99   Temp 98.6 F (37 C)   Resp 18    LMP 05/30/2018   SpO2 100%   Visual Acuity Right Eye Distance:   Left Eye Distance:   Bilateral Distance:    Right Eye Near:   Left Eye Near:    Bilateral Near:     Physical Exam  Constitutional: She is oriented to person, place, and time. She appears well-developed and well-nourished.  HENT:  Head: Normocephalic.  Eyes: Pupils are equal, round, and reactive to light. Conjunctivae are normal.  Neck: Normal range of motion. Neck supple.  Good range of motion of neck.  Tender to palpation of paravertebral muscles of cervical spine.  No bruising, erythema, deformities or swelling.  Pulmonary/Chest: Effort normal.  Musculoskeletal: Normal range of motion.  Neurological: She is alert and oriented to person, place, and time.  Skin: Skin is warm and dry.  Psychiatric: She has a normal mood and affect.  Nursing note and vitals reviewed.    UC Treatments / Results  Labs (all labs ordered are listed, but only abnormal results are displayed) Labs Reviewed - No data to display  EKG None  Radiology No results found.  Procedures Procedures (including critical care time)  Medications Ordered in UC Medications - No data to display  Initial Impression / Assessment and Plan / UC Course  I have reviewed the triage vital signs and the nursing notes.  Pertinent labs & imaging results that were available during my care of the patient were reviewed by me and considered in my medical decision making (see chart for details).     Cervical strain- will try low dose muscle relaxant and NSAID.  Follow up as needed.  Final Clinical Impressions(s) / UC Diagnoses   Final diagnoses:  Torticollis, acute     Discharge Instructions     It was nice meeting you!!  I believe that you have strained your neck muscles.  We will give you a low dose muscle relaxant and anti inflammatory  Heat and massage to the area could help.     ED Prescriptions  Medication Sig Dispense Auth. Provider    cyclobenzaprine (FLEXERIL) 10 MG tablet Take 0.5 tablets (5 mg total) by mouth at bedtime. 20 tablet Jayveion Stalling A, NP   naproxen (NAPROSYN) 500 MG tablet Take 1 tablet (500 mg total) by mouth 2 (two) times daily. 30 tablet Dahlia ByesBast, Sherion Dooly A, NP     Controlled Substance Prescriptions [place] Controlled Substance Registry consulted? Not Applicable   Janace ArisBast, Aowyn Rozeboom A, NP 06/04/18 1443

## 2018-06-13 ENCOUNTER — Emergency Department (HOSPITAL_COMMUNITY)
Admission: EM | Admit: 2018-06-13 | Discharge: 2018-06-13 | Discharge status: Against Medical Advice | Payer: Medicaid Other

## 2018-11-06 ENCOUNTER — Other Ambulatory Visit: Payer: Self-pay

## 2018-11-06 ENCOUNTER — Ambulatory Visit (HOSPITAL_COMMUNITY)
Admission: EM | Admit: 2018-11-06 | Discharge: 2018-11-06 | Disposition: A | Discharge status: Home / Self Care | Payer: Medicaid Other | Attending: Family Medicine | Admitting: Family Medicine

## 2018-11-06 ENCOUNTER — Encounter (HOSPITAL_COMMUNITY): Payer: Self-pay

## 2018-11-06 DIAGNOSIS — K047 Periapical abscess without sinus: Secondary | ICD-10-CM | POA: Insufficient documentation

## 2018-11-06 MED ORDER — HYDROCODONE-ACETAMINOPHEN 5-325 MG PO TABS
1.0000 | ORAL_TABLET | Freq: Once | ORAL | Status: AC
  Administered 2018-11-06: 1 via ORAL

## 2018-11-06 MED ORDER — KETOROLAC TROMETHAMINE 30 MG/ML IJ SOLN
30.0000 mg | Freq: Once | INTRAMUSCULAR | Status: AC
  Administered 2018-11-06: 30 mg via INTRAMUSCULAR

## 2018-11-06 MED ORDER — HYDROCODONE-ACETAMINOPHEN 5-325 MG PO TABS
ORAL_TABLET | ORAL | Status: AC
  Filled 2018-11-06: qty 1

## 2018-11-06 MED ORDER — PENICILLIN V POTASSIUM 500 MG PO TABS: 500.0000 mg | ORAL_TABLET | Freq: Four times a day (QID) | ORAL | 0 refills | Status: AC

## 2018-11-06 MED ORDER — HYDROCODONE-ACETAMINOPHEN 5-325 MG PO TABS: 1.0000 | ORAL_TABLET | Freq: Four times a day (QID) | ORAL | 0 refills | Status: DC | PRN

## 2018-11-06 MED ORDER — KETOROLAC TROMETHAMINE 30 MG/ML IJ SOLN
INTRAMUSCULAR | Status: AC
  Filled 2018-11-06: qty 1

## 2018-11-06 NOTE — Discharge Instructions (Signed)
Treating you for dental infection with penicillin Toradol and hydrocodone given here in clinic for pain Medication sent to [facility] on [address] You can pick that up in the morning I printed out some dental resources for follow-up

## 2018-11-06 NOTE — ED Triage Notes (Signed)
Pt presents today with tooth pain x3 days. States that she has had facial pain, jaw pain, and a headache. States that she has a wisdom tooth growing crooked and she also has a broken.

## 2018-11-08 NOTE — ED Provider Notes (Signed)
[facility]    CSN: [id number] Arrival date & time: 11/06/18  1741     History   Chief Complaint Chief Complaint  Patient presents with  . Dental Pain    HPI Regina Miller is a 23 y.o. female.   Patient is a 23 year old female that presents with dental pain x3 days.  She is also had some mild associated facial pain, jaw pain and headache.  She reports broken tooth and swelling to the gums with drainage.  Her symptoms have been constant and worsening.  She has been taking Tylenol and ibuprofen for the symptoms without much relief.  She is also been taking some amoxicillin that she had at home.  She reports noticed this helped her symptoms.  She denies any fevers, chills, night sweats, body aches.  She denies any trouble swallowing, trismus.  ROS per HPI      Past Medical History:  Diagnosis Date  . Acid reflux   . Anxiety   . Asthma   . Attention deficit disorder   . Heart murmur    Reports she had a heart murmur as a baby that has been evaluated. She is not currently followed by a cardiologist.    Patient Active Problem List   Diagnosis Date Noted  . First trimester pregnancy 07/26/2017  . Anxiety 12/22/2013  . Eczema 12/22/2013  . Body mass index, pediatric, greater than or 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 age 22/02/2014  . Acne 04/26/2012  . Insomnia 07/06/2011  . GERD 09/19/2008  . ATTENTION DEFICIT, W/HYPERACTIVITY 12/17/2006    Past Surgical History:  Procedure Laterality Date  . TONSILLECTOMY    . TYMPANOSTOMY TUBE PLACEMENT Bilateral     OB History    Gravida  1   Para      Term      Preterm      AB      Living        SAB      TAB      Ectopic      Multiple      Live Births               Home Medications    Prior to Admission medications   Medication Sig Start Date End Date Taking? Authorizing Provider  cyclobenzaprine (FLEXERIL) 10 MG tablet Take 0.5 tablets (5 mg total) by mouth at bedtime. 06/04/18   Dahlia ByesBast,  Tandrea Kommer A, NP  dicyclomine (BENTYL) 20 MG tablet Take 1 tablet (20 mg total) by mouth 2 (two) times daily. 06/02/18   Maczis, Elmer SowMichael M, PA-C  Doxylamine-Pyridoxine 10-10 MG TBEC 2 tablets on an empty stomach at bedtime on day 1 and 2. If symptoms persist, take 1 tablet in the morning and 2 tablets at bedtime on day 3. If symptoms persist, increase to 1 tablet in the morning, 1 tablet in the afternoon, and 2 tablets at bedtime daily. Patient not taking: Reported on 06/02/2018 07/23/17   Street, [place]cedes, [place]PA-C  HYDROcodone-acetaminophen (NORCO/VICODIN) 5-325 MG tablet Take 1 tablet by mouth every 6 (six) hours as needed. 11/06/18   Dahlia ByesBast, Jazman Reuter A, NP  ibuprofen (ADVIL,MOTRIN) 600 MG tablet Take 1 tablet (600 mg total) by mouth every 6 (six) hours as needed. Patient not taking: Reported on 06/02/2018 10/02/17   Arby BarrettePfeiffer, Marcy, MD  naproxen (NAPROSYN) 500 MG tablet Take 1 tablet (500 mg total) by mouth 2 (two) times daily. 06/04/18   Dahlia ByesBast, Elim Peale A, NP  penicillin v potassium (VEETID) 500 MG  tablet Take 1 tablet (500 mg total) by mouth 4 (four) times daily for 10 days. 11/06/18 11/16/18  Janace ArisBast, Sidhant Helderman A, NP  Prenatal Vit-Fe Fumarate-FA (PRENATAL COMPLETE) 14-0.4 MG TABS Take 1 tablet by mouth daily after breakfast. Patient not taking: Reported on 07/26/2017 07/23/17   Street, [place]cedes, PA-C    Family History Family History  Problem Relation Age of Onset  . Psoriasis Mother   . Hypertension Father   . Eczema Father   . Diabetes Father   . Heart disease Paternal Grandmother   . Cancer Maternal Grandmother     Social History Social History   Tobacco Use  . Smoking status: Former Smoker    Packs/day: 1.00  . Smokeless tobacco: Never Used  . Tobacco comment: Parents smoke around her  Substance Use Topics  . Alcohol use: Yes  . Drug use: Yes    Types: Marijuana     Allergies   Other   Review of Systems Review of Systems   Physical Exam Triage Vital Signs ED Triage Vitals  Enc Vitals Group       BP 11/06/18 1812 133/66     Pulse Rate 11/06/18 1812 87     Resp 11/06/18 1812 16     Temp 11/06/18 1812 98.7 F (37.1 C)     Temp Source 11/06/18 1812 Oral     SpO2 11/06/18 1812 98 %     Weight --      Height --      Head Circumference --      Peak Flow --      Pain Score 11/06/18 1815 4     Pain Loc --      Pain Edu? --      Excl. in GC? --    No data found.  Updated Vital Signs BP 133/66 (BP Location: Right Arm)   Pulse 87   Temp 98.7 F (37.1 C) (Oral)   Resp 16   SpO2 98%   Visual Acuity Right Eye Distance:   Left Eye Distance:   Bilateral Distance:    Right Eye Near:   Left Eye Near:    Bilateral Near:     Physical Exam Vitals signs and nursing note reviewed.  Constitutional:      General: She is not in acute distress.    Appearance: She is well-developed.  HENT:     Head: Normocephalic and atraumatic.     Mouth/Throat:      Comments: Broken tooth with decay Gingival erythema and swelling with abscess No trismus Mild facial swelling to the right side Eyes:     Conjunctiva/sclera: Conjunctivae normal.  Neck:     Musculoskeletal: Neck supple.  Cardiovascular:     Rate and Rhythm: Normal rate and regular rhythm.     Heart sounds: No murmur.  Pulmonary:     Effort: Pulmonary effort is normal. No respiratory distress.     Breath sounds: Normal breath sounds.  Abdominal:     Palpations: Abdomen is soft.     Tenderness: There is no abdominal tenderness.  Skin:    General: Skin is warm and dry.  Neurological:     Mental Status: She is alert.      UC Treatments / Results  Labs (all labs ordered are listed, but only abnormal results are displayed) Labs Reviewed - No data to display  EKG None  Radiology No results found.  Procedures Procedures (including critical care time)  Medications Ordered in UC Medications  ketorolac (TORADOL)  30 MG/ML injection 30 mg (30 mg Intramuscular Given 11/06/18 1843)  HYDROcodone-acetaminophen  (NORCO/VICODIN) 5-325 MG per tablet 1 tablet (1 tablet Oral Given 11/06/18 1843)    Initial Impression / Assessment and Plan / UC Course  I have reviewed the triage vital signs and the nursing notes.  Pertinent labs & imaging results that were available during my care of the patient were reviewed by me and considered in my medical decision making (see chart for details).     Patient with obvious broken tooth with decay and abscess in the right lower mouth,.  We will go ahead and treat with penicillin Hydrocodone given for pain Listed resources given for low income dental follow-up Good Rx coupons given for medications Strict instructions that if her swelling worsens or she has any trouble opening her mouth or swallowing she needs to go to the ER. Patient understanding and agreeable to plan. Final Clinical Impressions(s) / UC Diagnoses   Final diagnoses:  Dental infection     Discharge Instructions     Treating you for dental infection with penicillin Toradol and hydrocodone given here in clinic for pain Medication sent to [facility] on [address] You can pick that up in the morning I printed out some dental resources for follow-up     ED Prescriptions    Medication Sig Dispense Auth. Provider   penicillin v potassium (VEETID) 500 MG tablet Take 1 tablet (500 mg total) by mouth 4 (four) times daily for 10 days. 40 tablet Chritopher Coster A, NP   HYDROcodone-acetaminophen (NORCO/VICODIN) 5-325 MG tablet Take 1 tablet by mouth every 6 (six) hours as needed. 10 tablet Dahlia Byes A, NP     Controlled Substance Prescriptions [place] Controlled Substance Registry consulted? yes   Janace Aris, NP 11/08/18 [phone]

## 2019-06-27 IMAGING — US US OB TRANSVAGINAL
1 series · 15 of 28 positions shown · non-contrast
Comparison: None.

CLINICAL DATA: 21-year-old female reportedly 6 weeks pregnant with
abdominal cramping.

EXAM:
OBSTETRIC <14 WK US AND TRANSVAGINAL OB US
TECHNIQUE: Both transabdominal and transvaginal ultrasound examinations were
performed for complete evaluation of the gestation as well as the
maternal uterus, adnexal regions, and pelvic cul-de-sac.
Transvaginal technique was performed to assess early pregnancy.

[Series 1: us ob transvaginal · 15 of 44 slices shown]
[im 1/44]
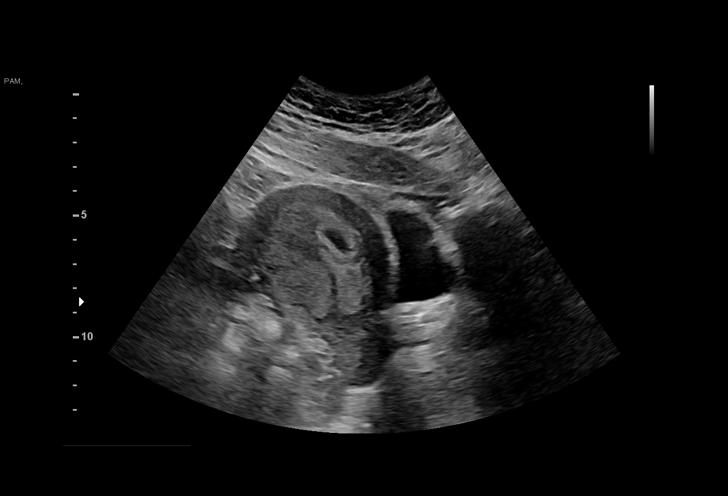
[im 4/44]
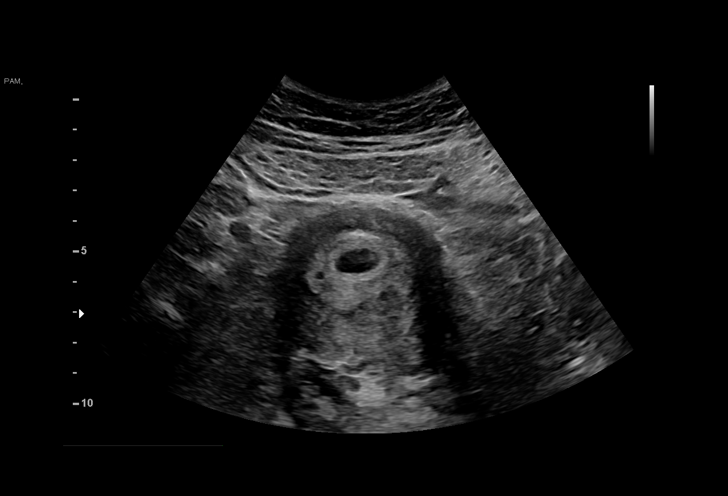
[im 7/44]
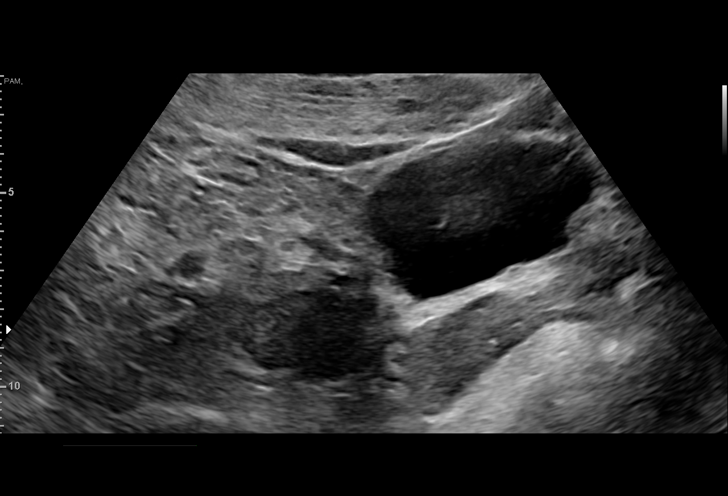
[im 10/44]
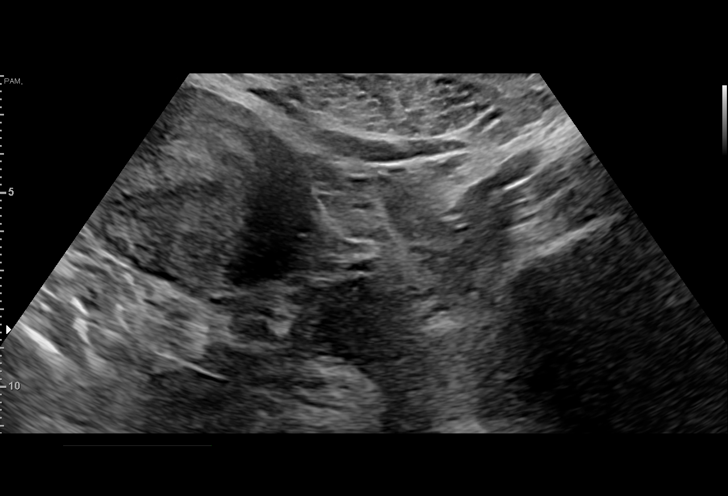
[im 13/44]
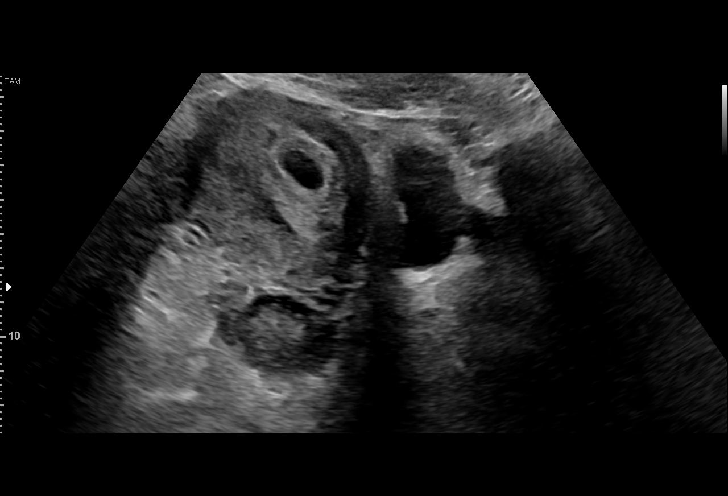
[im 16/44]
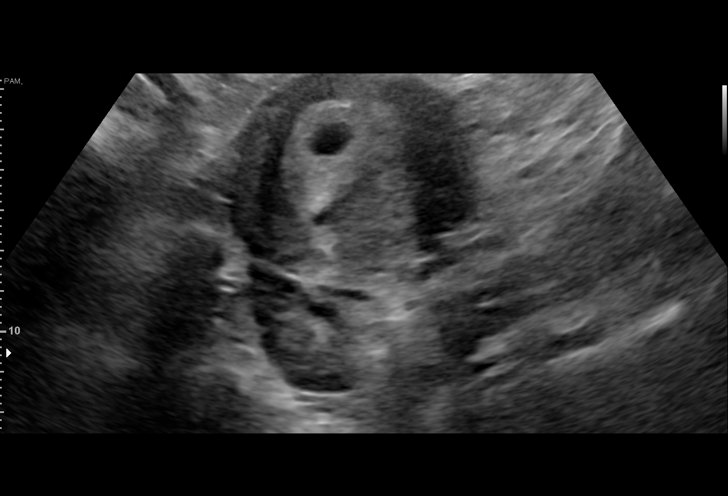
[im 20/44]
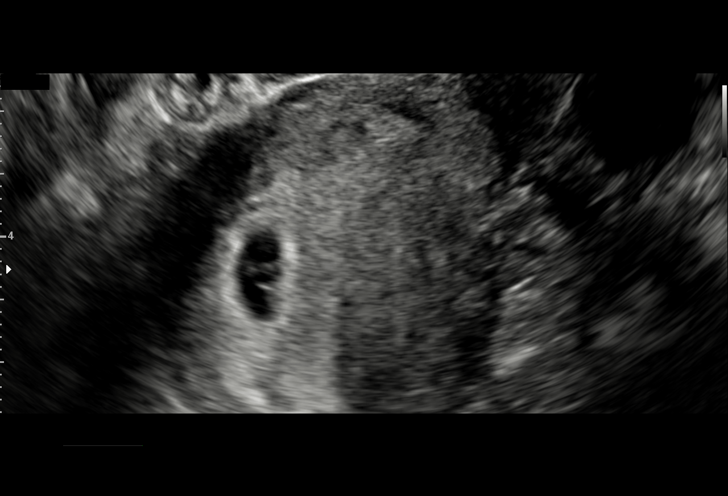
[im 23/44]
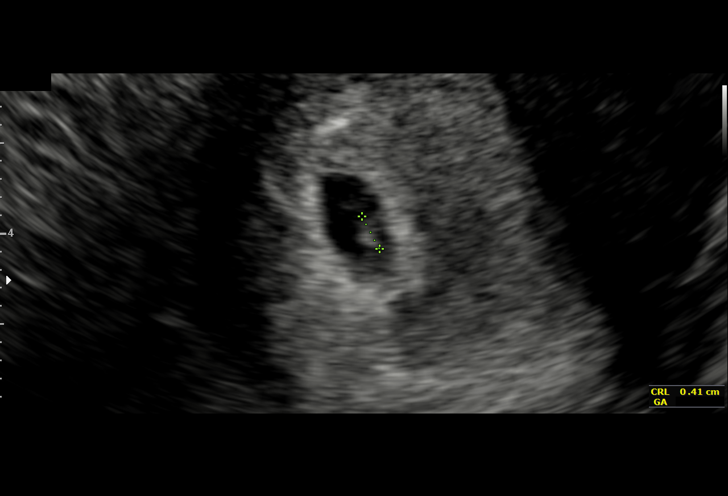
[im 24/44]
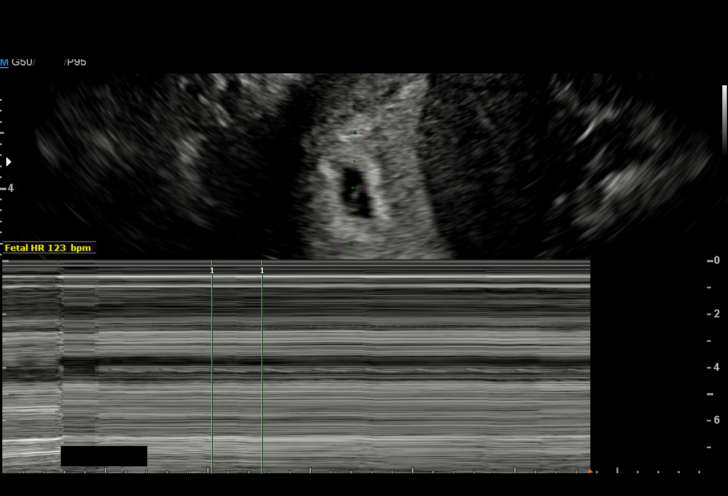
[im 28/44]
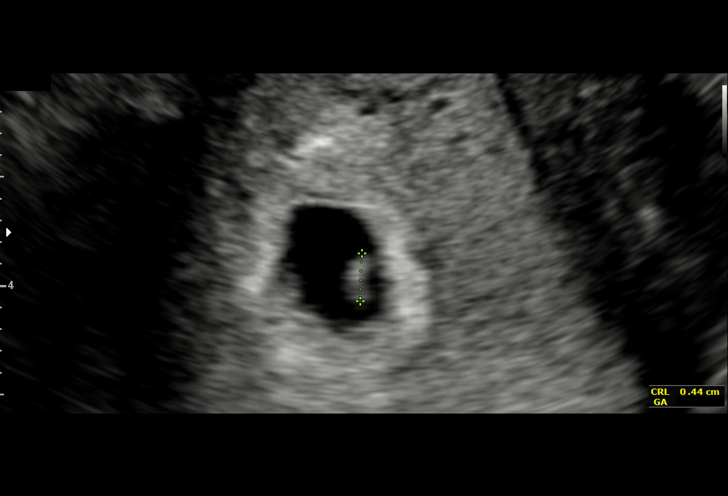
[im 31/44]
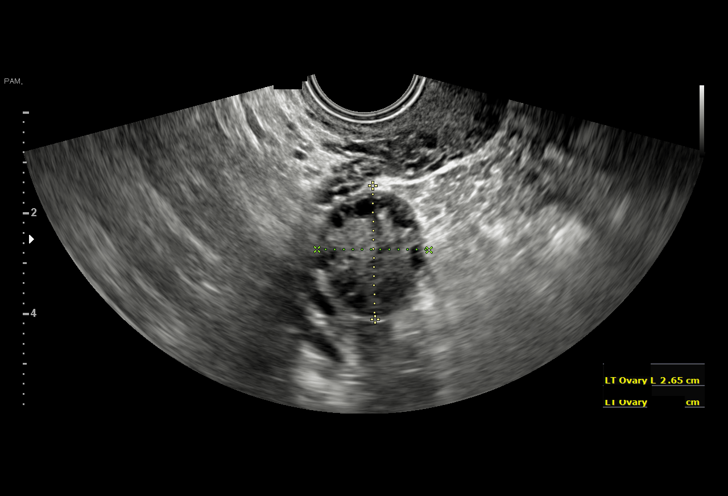
[im 34/44]
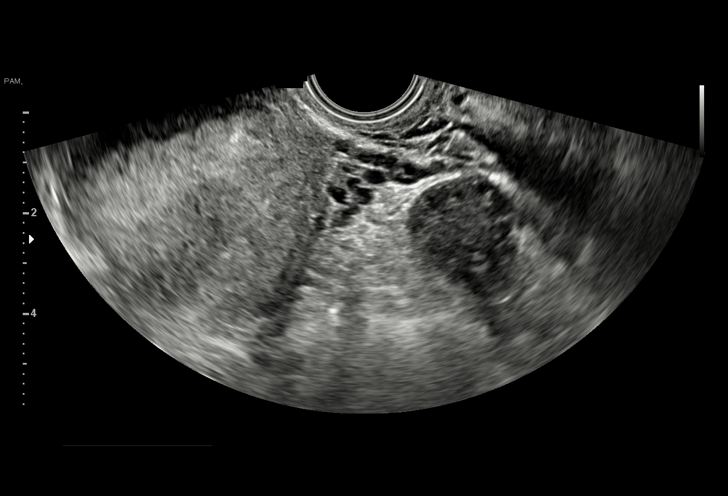
[im 37/44]
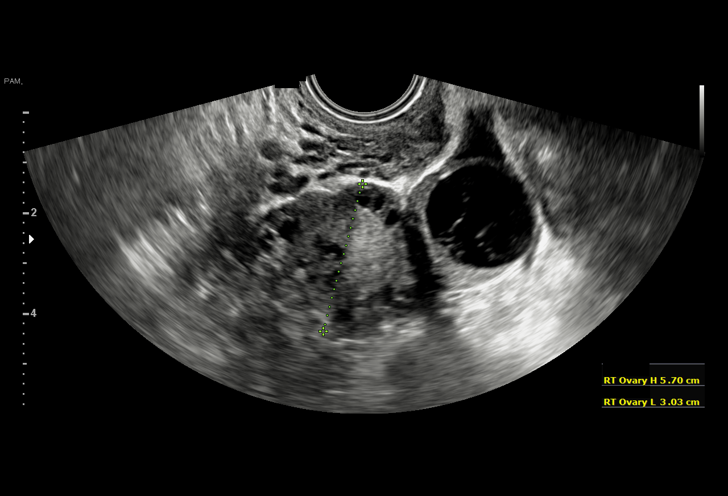
[im 40/44]
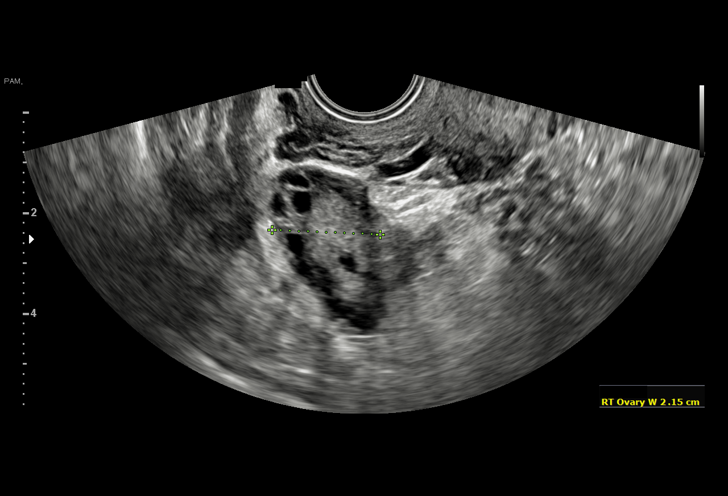
[im 44/44]
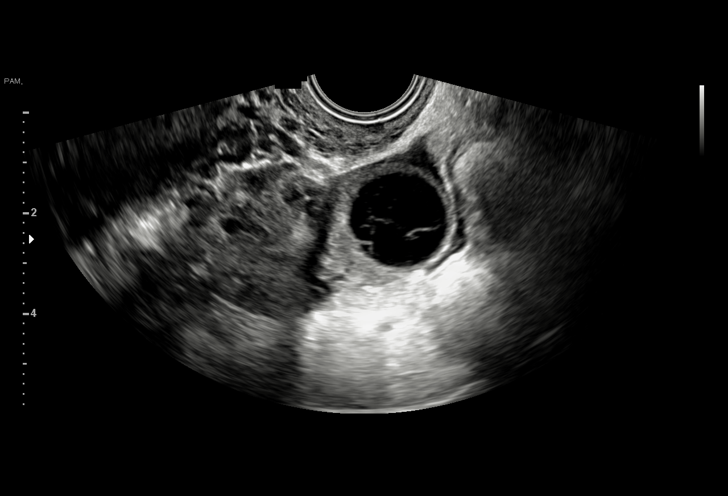

[15 of 28 positions shown; findings below may reference images not displayed]

FINDINGS: Intrauterine gestational sac: Single

Yolk sac:  Visualized.

Embryo:  Visualized.

Cardiac Activity: Visualized.

Heart Rate: 123  bpm

CRL:  4.6  mm   6 w   1 d                  US EDC: March 20, 2018

Subchorionic hemorrhage:  None visualized.

Maternal uterus/adnexae: Normal sonographic appearance of the
ovaries. The corpus luteal cyst is in the right ovary. No free
fluid.
IMPRESSION: Single viable intrauterine pregnancy.

The fetal heart rate is 123 beats per minute. By crown-rump length,
the estimated gestational age is 6 weeks 1 day and the estimated
date of confinement is March 20, 2018.

## 2019-07-10 ENCOUNTER — Ambulatory Visit (HOSPITAL_COMMUNITY)
Admission: EM | Admit: 2019-07-10 | Discharge: 2019-07-10 | Disposition: A | Discharge status: Home / Self Care | Payer: Medicaid Other | Attending: Family Medicine | Admitting: Family Medicine

## 2019-07-10 ENCOUNTER — Other Ambulatory Visit: Payer: Self-pay

## 2019-07-10 ENCOUNTER — Encounter (HOSPITAL_COMMUNITY): Payer: Self-pay | Admitting: Emergency Medicine

## 2019-07-10 ENCOUNTER — Other Ambulatory Visit: Payer: Self-pay | Admitting: Emergency Medicine

## 2019-07-10 DIAGNOSIS — Z202 Contact with and (suspected) exposure to infections with a predominantly sexual mode of transmission: Secondary | ICD-10-CM | POA: Insufficient documentation

## 2019-07-10 MED ORDER — AZITHROMYCIN 250 MG PO TABS
1000.0000 mg | ORAL_TABLET | Freq: Once | ORAL | Status: AC
  Administered 2019-07-10: 1000 mg via ORAL

## 2019-07-10 MED ORDER — AZITHROMYCIN 250 MG PO TABS
ORAL_TABLET | ORAL | Status: AC
  Filled 2019-07-10: qty 4

## 2019-07-10 NOTE — Discharge Instructions (Signed)
We have treated you today for chlamydia.  Will notify of any positive findings from your vaginal and blood testing and if any changes to treatment are needed.   Please withhold from intercourse for the next week. Please use condoms to prevent STD's.   Please establish with a primary care provider for birth control management and follow up as needed.

## 2019-07-10 NOTE — ED Provider Notes (Signed)
[facility]    CSN: [id number] Arrival date & time: 07/10/19  1237      History   Chief Complaint Chief Complaint  Patient presents with  . Exposure to STD    HPI Regina Miller is a 23 y.o. female.   Regina Miller presents with concerns about chlamydia exposure. States her boyfriend found out two days ago he tested positive for it. States he had gone to get testing without her knowing. They do not use condoms. She denies any vaginal or urinary symptoms. Denies sores, lesions, rash, itching or discharge. She has had chlamydia in the past. Has been having two periods a month for the past few months. Last was two weeks ago. Doesn't follow with a PCP or gynecologist. Doesn't take any birth control. History  Of asthma, add, acid reflux.    ROS per HPI, negative if not otherwise mentioned.      Past Medical History:  Diagnosis Date  . Acid reflux   . Anxiety   . Asthma   . Attention deficit disorder   . Heart murmur    Reports she had a heart murmur as a baby that has been evaluated. She is not currently followed by a cardiologist.    Patient Active Problem List   Diagnosis Date Noted  . First trimester pregnancy 07/26/2017  . Anxiety 12/22/2013  . Eczema 12/22/2013  . Body mass index, pediatric, greater than or 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 age 37/02/2014  . Acne 04/26/2012  . Insomnia 07/06/2011  . GERD 09/19/2008  . ATTENTION DEFICIT, W/HYPERACTIVITY 12/17/2006    Past Surgical History:  Procedure Laterality Date  . TONSILLECTOMY    . TYMPANOSTOMY TUBE PLACEMENT Bilateral     OB History    Gravida  1   Para      Term      Preterm      AB      Living        SAB      TAB      Ectopic      Multiple      Live Births               Home Medications    Prior to Admission medications   Medication Sig Start Date End Date Taking? Authorizing Provider  cyclobenzaprine (FLEXERIL) 10 MG tablet Take 0.5 tablets (5 mg total) by  mouth at bedtime. 06/04/18   Dahlia ByesBast, Traci A, NP  dicyclomine (BENTYL) 20 MG tablet Take 1 tablet (20 mg total) by mouth 2 (two) times daily. 06/02/18   Maczis, Elmer SowMichael M, PA-C  Doxylamine-Pyridoxine 10-10 MG TBEC 2 tablets on an empty stomach at bedtime on day 1 and 2. If symptoms persist, take 1 tablet in the morning and 2 tablets at bedtime on day 3. If symptoms persist, increase to 1 tablet in the morning, 1 tablet in the afternoon, and 2 tablets at bedtime daily. Patient not taking: Reported on 06/02/2018 07/23/17   Street, [place], [place]-C  HYDROcodone-acetaminophen (NORCO/VICODIN) 5-325 MG tablet Take 1 tablet by mouth every 6 (six) hours as needed. 11/06/18   Dahlia ByesBast, Traci A, NP  ibuprofen (ADVIL,MOTRIN) 600 MG tablet Take 1 tablet (600 mg total) by mouth every 6 (six) hours as needed. Patient not taking: Reported on 06/02/2018 10/02/17   Arby BarrettePfeiffer, Marcy, MD  naproxen (NAPROSYN) 500 MG tablet Take 1 tablet (500 mg total) by mouth 2 (two) times daily. 06/04/18   Janace ArisBast, Traci A, NP  Prenatal  Vit-Fe Fumarate-FA (PRENATAL COMPLETE) 14-0.4 MG TABS Take 1 tablet by mouth daily after breakfast. Patient not taking: Reported on 07/26/2017 07/23/17   Street, Hailey, PA-C    Family History Family History  Problem Relation Age of Onset  . Psoriasis Mother   . Hypertension Father   . Eczema Father   . Diabetes Father   . Heart disease Paternal Grandmother   . Cancer Maternal Grandmother     Social History Social History   Tobacco Use  . Smoking status: Former Smoker    Packs/day: 1.00  . Smokeless tobacco: Never Used  . Tobacco comment: Parents smoke around her  Substance Use Topics  . Alcohol use: Yes  . Drug use: Yes    Types: Marijuana     Allergies   Other   Review of Systems Review of Systems   Physical Exam Triage Vital Signs ED Triage Vitals  Enc Vitals Group     BP 07/10/19 1321 (!) 129/58     Pulse Rate 07/10/19 1321 68     Resp 07/10/19 1321 18     Temp 07/10/19 1321 98.1  F (36.7 C)     Temp Source 07/10/19 1321 Oral     SpO2 07/10/19 1321 98 %     Weight --      Height --      Head Circumference --      Peak Flow --      Pain Score 07/10/19 1322 0     Pain Loc --      Pain Edu? --      Excl. in GC? --    No data found.  Updated Vital Signs BP (!) 129/58 (BP Location: Left Arm)   Pulse 68   Temp 98.1 F (36.7 C) (Oral)   Resp 18   SpO2 98%    Physical Exam Constitutional:      General: She is not in acute distress.    Appearance: She is well-developed.  Cardiovascular:     Rate and Rhythm: Normal rate.  Pulmonary:     Effort: Pulmonary effort is normal.  Abdominal:     Palpations: Abdomen is soft. Abdomen is not rigid.     Tenderness: There is no abdominal tenderness. There is no guarding or rebound.  Genitourinary:    Comments: Denies sores, lesions, vaginal bleeding; no pelvic pain; gu exam deferred at this time, vaginal self swab collected.   Skin:    General: Skin is warm and dry.  Neurological:     Mental Status: She is alert and oriented to person, place, and time.      UC Treatments / Results  Labs (all labs ordered are listed, but only abnormal results are displayed) Labs Reviewed  RPR  HIV ANTIBODY (ROUTINE TESTING W REFLEX)  POC URINE PREG, ED  CERVICOVAGINAL ANCILLARY ONLY    EKG   Radiology No results found.  Procedures Procedures (including critical care time)  Medications Ordered in UC Medications  azithromycin (ZITHROMAX) tablet 1,000 mg (1,000 mg Oral Given 07/10/19 1344)  azithromycin (ZITHROMAX) 250 MG tablet (has no administration in time range)    Initial Impression / Assessment and Plan / UC Course  I have reviewed the triage vital signs and the nursing notes.  Pertinent labs & imaging results that were available during my care of the patient were reviewed by me and considered in my medical decision making (see chart for details).     Unexpected positive std screen from long term  partner. Std screen and hiv RPR collected and pending. Empiric azithromycin provided. Will notify of any positive findings and if any changes to treatment are needed.  Encouraged establish with pcp/ gynecologist for birth control needs. Patient verbalized understanding and agreeable to plan.   Final Clinical Impressions(s) / UC Diagnoses   Final diagnoses:  STD exposure     Discharge Instructions     We have treated you today for chlamydia.  Will notify of any positive findings from your vaginal and blood testing and if any changes to treatment are needed.   Please withhold from intercourse for the next week. Please use condoms to prevent STD's.   Please establish with a primary care provider for birth control management and follow up as needed.    ED Prescriptions    None     PDMP not reviewed this encounter.   Zigmund Gottron, NP 07/10/19 1416

## 2019-07-10 NOTE — ED Triage Notes (Signed)
Pt here for exposure to chlamydia

## 2019-07-10 NOTE — ED Notes (Signed)
Pregnancy test result-Negative

## 2019-07-11 ENCOUNTER — Telehealth (HOSPITAL_COMMUNITY): Payer: Self-pay | Admitting: Emergency Medicine

## 2019-07-11 ENCOUNTER — Encounter (HOSPITAL_COMMUNITY): Payer: Self-pay

## 2019-07-11 LAB — CERVICOVAGINAL ANCILLARY ONLY
Bacterial Vaginitis (gardnerella): NEGATIVE
Candida Glabrata: NEGATIVE
Candida Vaginitis: POSITIVE — AB
Molecular Disclaimer: NEGATIVE
Molecular Disclaimer: NEGATIVE
Molecular Disclaimer: NEGATIVE
Molecular Disclaimer: NORMAL
Trichomonas: NEGATIVE

## 2019-07-11 LAB — HIV ANTIBODY (ROUTINE TESTING W REFLEX): HIV Screen 4th Generation wRfx: NONREACTIVE

## 2019-07-11 LAB — RPR: RPR Ser Ql: NONREACTIVE

## 2019-07-11 LAB — POCT PREGNANCY, URINE: Preg Test, Ur: NEGATIVE

## 2019-07-11 MED ORDER — FLUCONAZOLE 150 MG PO TABS: 150.0000 mg | ORAL_TABLET | Freq: Once | ORAL | 0 refills | Status: AC

## 2019-07-11 NOTE — Telephone Encounter (Signed)
Test for candida (yeast) was positive. Prescription for fluconazole 150mg  po now, repeat dose in 3d if needed, #2 no refills, sent to the pharmacy of record. Recheck or followup with PCP for further evaluation if symptoms are not improving.    Attempted to reach patient. No answer at this time. Mailbox full.

## 2019-07-12 LAB — CERVICOVAGINAL ANCILLARY ONLY
Chlamydia: NEGATIVE
Neisseria Gonorrhea: NEGATIVE

## 2019-07-13 ENCOUNTER — Telehealth (HOSPITAL_COMMUNITY): Payer: Self-pay | Admitting: Emergency Medicine

## 2019-07-13 NOTE — Telephone Encounter (Signed)
Patient contacted and made aware of  Negative G/C  results, all questions answered

## 2019-07-19 ENCOUNTER — Ambulatory Visit: Payer: Self-pay | Admitting: Registered Nurse

## 2019-07-19 ENCOUNTER — Other Ambulatory Visit: Payer: Self-pay

## 2019-07-19 ENCOUNTER — Encounter: Payer: Self-pay | Admitting: Registered Nurse

## 2019-07-19 VITALS — BP 99/56 | HR 59 | Temp 98.1°F

## 2019-07-19 DIAGNOSIS — H6982 Other specified disorders of Eustachian tube, left ear: Secondary | ICD-10-CM

## 2019-07-19 DIAGNOSIS — H6691 Otitis media, unspecified, right ear: Secondary | ICD-10-CM

## 2019-07-19 DIAGNOSIS — J019 Acute sinusitis, unspecified: Secondary | ICD-10-CM

## 2019-07-19 MED ORDER — IBUPROFEN 800 MG PO TABS: 800.0000 mg | ORAL_TABLET | Freq: Three times a day (TID) | ORAL | 0 refills | Status: DC

## 2019-07-19 MED ORDER — AMOXICILLIN 875 MG PO TABS: 875.0000 mg | ORAL_TABLET | Freq: Two times a day (BID) | ORAL | 0 refills | Status: DC

## 2019-07-19 MED ORDER — SALINE SPRAY 0.65 % NA SOLN: 2.0000 | NASAL | 0 refills | Status: DC

## 2019-07-19 MED ORDER — DIPHENHYDRAMINE HCL 25 MG PO CAPS: 25.0000 mg | ORAL_CAPSULE | Freq: Three times a day (TID) | ORAL | 0 refills | Status: DC | PRN

## 2019-07-19 MED ORDER — LORATADINE 10 MG PO TABS: 10.0000 mg | ORAL_TABLET | Freq: Every day | ORAL | 0 refills | Status: DC

## 2019-07-19 NOTE — Patient Instructions (Signed)
How to Perform a Sinus Rinse A sinus rinse is a home treatment that is used to rinse your sinuses with a sterile mixture of salt and water (saline solution). Sinuses are air-filled spaces in your skull behind the bones of your face and forehead that open into your nasal cavity. A sinus rinse can help to clear mucus, dirt, dust, or pollen from your nasal cavity. You may do a sinus rinse when you have a cold, a virus, nasal allergy symptoms, a sinus infection, or stuffiness in your nose or sinuses. Talk with your health care provider about whether a sinus rinse might help you. What are the risks? A sinus rinse is generally safe and effective. However, there are a few risks, which include:  A burning sensation in your sinuses. This may happen if you do not make the saline solution as directed. Be sure to follow all directions when making the saline solution.  Nasal irritation.  Infection from contaminated water. This is rare, but possible. Do not do a sinus rinse if you have had ear or nasal surgery, ear infection, or blocked ears. Supplies needed:  Saline solution or powder.  Distilled or sterile water may be needed to mix with saline powder. ? You may use boiled and cooled tap water. Boil tap water for 5 minutes; cool until it is lukewarm. Use within 24 hours. ? Do not use regular tap water to mix with the saline solution.  Neti pot or nasal rinse bottle. These supplies release the saline solution into your nose and through your sinuses. Neti pots and nasal rinse bottles can be purchased at [employer], a health food store, or online. How to perform a sinus rinse  1. Wash your hands with soap and water. 2. Wash your device according to the directions that came with the product and then dry it. 3. Use the solution that comes with your product or one that is sold separately in stores. Follow the mixing directions on the package if you need to mix with sterile or distilled water. 4.  Fill the device with the amount of saline solution noted in the device instructions. 5. Stand over a sink and tilt your head sideways over the sink. 6. Place the spout of the device in your upper nostril (the one closer to the ceiling). 7. Gently pour or squeeze the saline solution into your nasal cavity. The liquid should drain out from the lower nostril if you are not too congested. 8. While rinsing, breathe through your open mouth. 9. Gently blow your nose to clear any mucus and rinse solution. Blowing too hard may cause ear pain. 10. Repeat in your other nostril. 11. Clean and rinse your device with clean water and then air-dry it. Talk with your health care provider or pharmacist if you have questions about how to do a sinus rinse. Summary  A sinus rinse is a home treatment that is used to rinse your sinuses with a sterile mixture of salt and water (saline solution).  A sinus rinse is generally safe and effective. Follow all instructions carefully.  Before doing a sinus rinse, talk with your health care provider about whether it would be helpful for you. This information is not intended to replace advice given to you by your health care provider. Make sure you discuss any questions you have with your health care provider. Document Released: 05/03/2014 Document Revised: 08/03/2017 Document Reviewed: 08/03/2017 Elsevier Patient Education  2020 [employer] Eustachian Tube Dysfunction  Eustachian tube  dysfunction refers to a condition in which a blockage develops in the narrow passage that connects the middle ear to the back of the nose (eustachian tube). The eustachian tube regulates air pressure in the middle ear by letting air move between the ear and nose. It also helps to drain fluid from the middle ear space. Eustachian tube dysfunction can affect one or both ears. When the eustachian tube does not function properly, air pressure, fluid, or both can build up in the middle ear. What  are the causes? This condition occurs when the eustachian tube becomes blocked or cannot open normally. Common causes of this condition include:  Ear infections.  Colds and other infections that affect the nose, mouth, and throat (upper respiratory tract).  Allergies.  Irritation from cigarette smoke.  Irritation from stomach acid coming up into the esophagus (gastroesophageal reflux). The esophagus is the tube that carries food from the mouth to the stomach.  Sudden changes in air pressure, such as from descending in an airplane or scuba diving.  Abnormal growths in the nose or throat, such as: ? Growths that line the nose (nasal polyps). ? Abnormal growth of cells (tumors). ? Enlarged tissue at the back of the throat (adenoids). What increases the risk? You are more likely to develop this condition if:  You smoke.  You are overweight.  You are a child who has: ? Certain birth defects of the mouth, such as cleft palate. ? Large tonsils or adenoids. What are the signs or symptoms? Common symptoms of this condition include:  A feeling of fullness in the ear.  Ear pain.  Clicking or popping noises in the ear.  Ringing in the ear.  Hearing loss.  Loss of balance.  Dizziness. Symptoms may get worse when the air pressure around you changes, such as when you travel to an area of high elevation, fly on an airplane, or go scuba diving. How is this diagnosed? This condition may be diagnosed based on:  Your symptoms.  A physical exam of your ears, nose, and throat.  Tests, such as those that measure: ? The movement of your eardrum (tympanogram). ? Your hearing (audiometry). How is this treated? Treatment depends on the cause and severity of your condition.  In mild cases, you may relieve your symptoms by moving air into your ears. This is called "popping the ears."  In more severe cases, or if you have symptoms of fluid in your ears, treatment may include: ?  Medicines to relieve congestion (decongestants). ? Medicines that treat allergies (antihistamines). ? Nasal sprays or ear drops that contain medicines that reduce swelling (steroids). ? A procedure to drain the fluid in your eardrum (myringotomy). In this procedure, a small tube is placed in the eardrum to:  Drain the fluid.  Restore the air in the middle ear space. ? A procedure to insert a balloon device through the nose to inflate the opening of the eustachian tube (balloon dilation). Follow these instructions at home: Lifestyle  Do not do any of the following until your health care provider approves: ? Travel to high altitudes. ? Fly in airplanes. ? Work in a Estate agentpressurized cabin or room. ? Scuba dive.  Do not use any products that contain nicotine or tobacco, such as cigarettes and e-cigarettes. If you need help quitting, ask your health care provider.  Keep your ears dry. Wear fitted earplugs during showering and bathing. Dry your ears completely after. General instructions  Take over-the-counter and prescription medicines only as  told by your health care provider.  Use techniques to help pop your ears as recommended by your health care provider. These may include: ? Chewing gum. ? Yawning. ? Frequent, forceful swallowing. ? Closing your mouth, holding your nose closed, and gently blowing as if you are trying to blow air out of your nose.  Keep all follow-up visits as told by your health care provider. This is important. Contact a health care provider if:  Your symptoms do not go away after treatment.  Your symptoms come back after treatment.  You are unable to pop your ears.  You have: ? A fever. ? Pain in your ear. ? Pain in your head or neck. ? Fluid draining from your ear.  Your hearing suddenly changes.  You become very dizzy.  You lose your balance. Summary  Eustachian tube dysfunction refers to a condition in which a blockage develops in the eustachian  tube.  It can be caused by ear infections, allergies, inhaled irritants, or abnormal growths in the nose or throat.  Symptoms include ear pain, hearing loss, or ringing in the ears.  Mild cases are treated with maneuvers to unblock the ears, such as yawning or ear popping.  Severe cases are treated with medicines. Surgery may also be done (rare). This information is not intended to replace advice given to you by your health care provider. Make sure you discuss any questions you have with your health care provider. Document Released: 11/02/2015 Document Revised: 01/26/2018 Document Reviewed: 01/26/2018 Elsevier Patient Education  [employer]. Otitis Media, Adult  Otitis media occurs when there is inflammation and fluid in the middle ear. Your middle ear is a part of the ear that contains bones for hearing as well as air that helps send sounds to your brain. What are the causes? This condition is caused by a blockage in the eustachian tube. This tube drains fluid from the ear to the back of the nose (nasopharynx). A blockage in this tube can be caused by an object or by swelling (edema) in the tube. Problems that can cause a blockage include:  A cold or other upper respiratory infection.  Allergies.  An irritant, such as tobacco smoke.  Enlarged adenoids. The adenoids are areas of soft tissue located high in the back of the throat, behind the nose and the roof of the mouth.  A mass in the nasopharynx.  Damage to the ear caused by pressure changes (barotrauma). What are the signs or symptoms? Symptoms of this condition include:  Ear pain.  A fever.  Decreased hearing.  A headache.  Tiredness (lethargy).  Fluid leaking from the ear.  Ringing in the ear. How is this diagnosed? This condition is diagnosed with a physical exam. During the exam your health care provider will use an instrument called an otoscope to look into your ear and check for redness, swelling, and  fluid. He or she will also ask about your symptoms. Your health care provider may also order tests, such as:  A test to check the movement of the eardrum (pneumatic otoscopy). This test is done by squeezing a small amount of air into the ear.  A test that changes air pressure in the middle ear to check how well the eardrum moves and whether the eustachian tube is working (tympanogram). How is this treated? This condition usually goes away on its own within 3-5 days. But if the condition is caused by a bacteria infection and does not go away own its  own, or keeps coming back, your health care provider may:  Prescribe antibiotic medicines to treat the infection.  Prescribe or recommend medicines to control pain. Follow these instructions at home:  Take over-the-counter and prescription medicines only as told by your health care provider.  If you were prescribed an antibiotic medicine, take it as told by your health care provider. Do not stop taking the antibiotic even if you start to feel better.  Keep all follow-up visits as told by your health care provider. This is important. Contact a health care provider if:  You have bleeding from your nose.  There is a lump on your neck.  You are not getting better in 5 days.  You feel worse instead of better. Get help right away if:  You have severe pain that is not controlled with medicine.  You have swelling, redness, or pain around your ear.  You have stiffness in your neck.  A part of your face is paralyzed.  The bone behind your ear (mastoid) is tender when you touch it.  You develop a severe headache. Summary  Otitis media is redness, soreness, and swelling of the middle ear.  This condition usually goes away on its own within 3-5 days.  If the problem does not go away in 3-5 days, your health care provider may prescribe or recommend medicines to treat your symptoms.  If you were prescribed an antibiotic medicine, take it  as told by your health care provider. This information is not intended to replace advice given to you by your health care provider. Make sure you discuss any questions you have with your health care provider. Document Released: 07/11/2004 Document Revised: 09/18/2017 Document Reviewed: 09/26/2016 Elsevier Patient Education  2020 [employer]. Allergic Rhinitis, Adult Allergic rhinitis is an allergic reaction that affects the mucous membrane inside the nose. It causes sneezing, a runny or stuffy nose, and the feeling of mucus going down the back of the throat (postnasal drip). Allergic rhinitis can be mild to severe. There are two types of allergic rhinitis:  Seasonal. This type is also called hay fever. It happens only during certain seasons.  Perennial. This type can happen at any time of the year. What are the causes? This condition happens when the body's defense system (immune system) responds to certain harmless substances called allergens as though they were germs.  Seasonal allergic rhinitis is triggered by pollen, which can come from grasses, trees, and weeds. Perennial allergic rhinitis may be caused by:  House dust mites.  Pet dander.  Mold spores. What are the signs or symptoms? Symptoms of this condition include:  Sneezing.  Runny or stuffy nose (nasal congestion).  Postnasal drip.  Itchy nose.  Tearing of the eyes.  Trouble sleeping.  Daytime sleepiness. How is this diagnosed? This condition may be diagnosed based on:  Your medical history.  A physical exam.  Tests to check for related conditions, such as: ? Asthma. ? Pink eye. ? Ear infection. ? Upper respiratory infection.  Tests to find out which allergens trigger your symptoms. These may include skin or blood tests. How is this treated? There is no cure for this condition, but treatment can help control symptoms. Treatment may include:  Taking medicines that block allergy symptoms, such as  antihistamines. Medicine may be given as a shot, nasal spray, or pill.  Avoiding the allergen.  Desensitization. This treatment involves getting ongoing shots until your body becomes less sensitive to the allergen. This treatment may be done  if other treatments do not help.  If taking medicine and avoiding the allergen does not work, new, stronger medicines may be prescribed. Follow these instructions at home:  Find out what you are allergic to. Common allergens include smoke, dust, and pollen.  Avoid the things you are allergic to. These are some things you can do to help avoid allergens: ? Replace carpet with wood, tile, or vinyl flooring. Carpet can trap dander and dust. ? Do not smoke. Do not allow smoking in your home. ? Change your heating and air conditioning filter at least once a month. ? During allergy season:  Keep windows closed as much as possible.  Plan outdoor activities when pollen counts are lowest. This is usually during the evening hours.  When coming indoors, change clothing and shower before sitting on furniture or bedding.  Take over-the-counter and prescription medicines only as told by your health care provider.  Keep all follow-up visits as told by your health care provider. This is important. Contact a health care provider if:  You have a fever.  You develop a persistent cough.  You make whistling sounds when you breathe (you wheeze).  Your symptoms interfere with your normal daily activities. Get help right away if:  You have shortness of breath. Summary  This condition can be managed by taking medicines as directed and avoiding allergens.  Contact your health care provider if you develop a persistent cough or fever.  During allergy season, keep windows closed as much as possible. This information is not intended to replace advice given to you by your health care provider. Make sure you discuss any questions you have with your health care  provider. Document Released: 07/01/2001 Document Revised: 09/18/2017 Document Reviewed: 11/13/2016 Elsevier Patient Education  2020 [employer].

## 2019-07-19 NOTE — Progress Notes (Signed)
Subjective:    Patient ID: Regina Miller, female    DOB: 1995/12/10, 23 y.o.   MRN: [id number]  23y/o single african [place] female new pt being seen for the first time today. She is a recent hire at [employer]. She c/o 3 days of bilateral otalgia with bilateral ear drainage, muffled sounds, sharp pain.  Sometimes pain radiates from ear down neck to jaw on right.   Denies sore throat, sinusitis, HA. No home treatments for pain.   Stated fall usually gets runny nose but not in the spring.  Doesn't take any allergy medications.  PSHx PE tubes  Has taken amoxicillin without side effects in the past.  Has benadryl at home but hasn't taken any.  Works in [employer].  Denied dental problem/pain today.     Review of Systems  Constitutional: Negative for activity change, appetite change, chills, diaphoresis, fatigue and fever.  HENT: Positive for ear discharge, ear pain, hearing loss, rhinorrhea and sinus pressure. Negative for congestion, dental problem, drooling, facial swelling, mouth sores, nosebleeds, postnasal drip, sinus pain, sneezing, sore throat, trouble swallowing and voice change.   Eyes: Negative for photophobia, pain, discharge, redness, itching and visual disturbance.  Respiratory: Negative for cough, choking, shortness of breath, wheezing and stridor.   Cardiovascular: Negative for chest pain.  Gastrointestinal: Negative for abdominal pain, diarrhea, nausea and vomiting.  Endocrine: Negative for cold intolerance and heat intolerance.  Genitourinary: Negative for difficulty urinating.  Musculoskeletal: Negative for arthralgias, back pain, gait problem, joint swelling, myalgias, neck pain and neck stiffness.  Allergic/Immunologic: Positive for environmental allergies. Negative for food allergies and immunocompromised state.  Neurological: Negative for tremors, seizures, syncope, facial asymmetry, speech difficulty, weakness, light-headedness, numbness and headaches.   Hematological: Negative for adenopathy. Does not bruise/bleed easily.  Psychiatric/Behavioral: Negative for agitation, confusion and sleep disturbance.       Objective:   Physical Exam Vitals signs and nursing note reviewed.  Constitutional:      General: She is awake. She is not in acute distress.    Appearance: Normal appearance. She is well-developed and well-groomed. She is obese. She is not ill-appearing, toxic-appearing or diaphoretic.  HENT:     Head: Normocephalic and atraumatic. No right periorbital erythema or left periorbital erythema.     Jaw: There is normal jaw occlusion. No trismus.     Salivary Glands: Right salivary gland is not diffusely enlarged or tender. Left salivary gland is not diffusely enlarged or tender.     Right Ear: Hearing, ear canal and external ear normal. A middle ear effusion is present. There is no impacted cerumen. Tympanic membrane is scarred, erythematous and bulging.     Left Ear: Hearing, ear canal and external ear normal. A middle ear effusion is present. There is no impacted cerumen.     Ears:      Nose: Mucosal edema, congestion and rhinorrhea present. No nasal deformity, septal deviation, signs of injury, laceration or nasal tenderness.     Right Turbinates: Enlarged and swollen. Not pale.     Left Turbinates: Enlarged and swollen. Not pale.     Right Sinus: No maxillary sinus tenderness or frontal sinus tenderness.     Left Sinus: No maxillary sinus tenderness or frontal sinus tenderness.     Comments: Pain behind eyes when bending torso forward to tie shoes/touch toes; wearing cloth mask due to covid 19 pandemic; occasional nasal sniffing noted in exam room; clear discharge bilateral nasal turbinates edema/erythema; cobblesotning posteiror pharynx; bilateral  allergic shiners    Mouth/Throat:     Lips: Pink. No lesions.     Mouth: Mucous membranes are moist. Mucous membranes are not pale, not dry and not cyanotic. No injury, lacerations, oral  lesions or angioedema.     Dentition: Normal dentition. Does not have dentures. No dental tenderness, gingival swelling, dental caries, dental abscesses or gum lesions.     Tongue: No lesions. Tongue does not deviate from midline.     Palate: No mass and lesions.     Pharynx: Uvula midline. Pharyngeal swelling and posterior oropharyngeal erythema present. No oropharyngeal exudate or uvula swelling.     Tonsils: No tonsillar exudate or tonsillar abscesses. 0 on the right. 0 on the left.  Eyes:     General: Lids are normal. Vision grossly intact. Gaze aligned appropriately. Allergic shiner present. No visual field deficit or scleral icterus.       Right eye: No foreign body, discharge or hordeolum.        Left eye: No foreign body, discharge or hordeolum.     Extraocular Movements: Extraocular movements intact.     Right eye: Normal extraocular motion and no nystagmus.     Left eye: Normal extraocular motion and no nystagmus.     Conjunctiva/sclera: Conjunctivae normal.     Right eye: Right conjunctiva is not injected. No chemosis, exudate or hemorrhage.    Left eye: Left conjunctiva is not injected. No chemosis, exudate or hemorrhage.    Pupils: Pupils are equal, round, and reactive to light. Pupils are equal.     Right eye: Pupil is round and reactive.     Left eye: Pupil is round and reactive.  Neck:     Musculoskeletal: Normal range of motion and neck supple. Normal range of motion. No edema, erythema, neck rigidity, spinous process tenderness or muscular tenderness.     Thyroid: No thyroid mass or thyromegaly.     Trachea: Trachea and phonation normal. No tracheal tenderness or tracheal deviation.  Cardiovascular:     Rate and Rhythm: Normal rate and regular rhythm.     Chest Wall: PMI is not displaced.     Pulses:          Femoral pulses are 2+ on the right side and 2+ on the left side.    Heart sounds: Normal heart sounds, S1 normal and S2 normal. No murmur. No friction rub. No  gallop.   Pulmonary:     Effort: Pulmonary effort is normal. No accessory muscle usage or respiratory distress.     Breath sounds: Normal breath sounds and air entry. No stridor, decreased air movement or transmitted upper airway sounds. No decreased breath sounds, wheezing, rhonchi or rales.     Comments: No cough observed in exam room; spoke full sentences without difficulty Chest:     Chest wall: No tenderness.  Abdominal:     General: There is no distension.     Palpations: Abdomen is soft.  Musculoskeletal: Normal range of motion.        General: No tenderness.     Right shoulder: Normal.     Left shoulder: Normal.     Right elbow: Normal.    Left elbow: Normal.     Right hip: Normal.     Left hip: Normal.     Right knee: Normal.     Left knee: Normal.     Cervical back: Normal.     Thoracic back: Normal.     Lumbar back: Normal.  Right hand: Normal.     Left hand: Normal.  Lymphadenopathy:     Head:     Right side of head: No submental, submandibular, tonsillar, preauricular, posterior auricular or occipital adenopathy.     Left side of head: No submental, submandibular, tonsillar, preauricular, posterior auricular or occipital adenopathy.     Cervical: No cervical adenopathy.     Right cervical: No superficial, deep or posterior cervical adenopathy.    Left cervical: No superficial, deep or posterior cervical adenopathy.  Skin:    General: Skin is warm and dry.     Capillary Refill: Capillary refill takes less than 2 seconds.     Coloration: Skin is not ashen, cyanotic, jaundiced, mottled, pale or sallow.     Findings: No abrasion, abscess, acne, bruising, burn, ecchymosis, erythema, laceration, lesion, petechiae, rash or wound.     Nails: There is no clubbing.   Neurological:     General: No focal deficit present.     Mental Status: She is alert and oriented to person, place, and time. She is not disoriented.     GCS: GCS eye subscore is 4. GCS verbal subscore is  5. GCS motor subscore is 6.     Cranial Nerves: Cranial nerves are intact. No cranial nerve deficit, dysarthria or facial asymmetry.     Sensory: Sensation is intact. No sensory deficit.     Motor: Motor function is intact. No weakness, tremor, atrophy, abnormal muscle tone or seizure activity.     Coordination: Coordination is intact. Coordination normal.     Gait: Gait is intact. Gait normal.     Comments: Gait sure and steady in hallway; in/out of chair and on/off exam table without difficulty  Psychiatric:        Attention and Perception: Attention and perception normal.        Mood and Affect: Mood and affect normal.        Speech: Speech normal.        Behavior: Behavior normal. Behavior is cooperative.        Thought Content: Thought content normal.        Cognition and Memory: Cognition and memory normal.        Judgment: Judgment normal.           Assessment & Plan:  A-acute right otitis media; acute rhinosinusitis; eustachian tube dysfunction left, obesity  P-Supportive treatment.  Discussed post nasal drip causes swelling throat/blocks eustachian tubes and fluid back up into middle ear/unable to drain.  When turning head compresses eustachian tube may result in pain intermittent also occurs with swallowing.  Will probably take one month for middle ear fluid to resolve with taking allergy medication every day for a month; currently rag weed pollen counts high in local area.   Amoxicillin 875mg  po BID x 10 days #20 RF0 dispensed from [facility] to patient  Motrin 800mg  po TID prn pain take with food given 8 UD from clinic stock.  Patient stated tylenol never works for her.   No evidence of invasive bacterial infection, non toxic and well hydrated.  This is most likely self limiting viral infection.  I do not see where any further testing or imaging is necessary at this time.   I will suggest supportive care, rest, good hygiene and encourage the patient to take adequate fluids.  The  patient is to return to clinic or EMERGENCY ROOM if symptoms worsen or change significantly e.g. ear pain, fever, purulent discharge from ears or bleeding.  Exitcare handout on otitis media and eustachian tube dysfunction printed and given to patient.   Patient verbalized agreement and understanding of treatment plan and had no further questions at this time.    Patient may use normal saline nasal spray 2 sprays each nostril q2h wa as needed given 1 bottle from clinic stock  Trial of OTC antihistamine patient has benadryl at home discussed may need to take up to TID  possible side effects drowsiness so trial at bedtime.  If making her too drowsy consider changing to claritin or zyrtec.  Other option OTC flonase 1 spray each nostril BID after first using nasal saline.  Patient denied personal or family history of ENT cancer.  OTC antihistamine of choice.   Avoid triggers if possible.  Shower prior to bedtime.  If allergic dust/dust mites recommend mattress/pillow covers/encasements; washing linens, vacuuming, sweeping, dusting weekly.  Call or return to clinic as needed if these symptoms worsen or fail to improve as anticipated.   Exitcare handout on allergic rhinitis, sinusitis and sinus rinse given to patient.  Amoxicillin for otitis media will cover for sinusitis also.  Patient verbalized understanding of instructions, agreed with plan of care and had no further questions at this time.  P2:  Avoidance and hand washing.  Avoid further weight gain.  Exercise 150 minutes per week recommended.  Healthy diet whole grains/lean protein/dairy and fruits/vegetables recommended for meals TID.  Follow up with PCM.

## 2019-07-21 ENCOUNTER — Encounter: Payer: Self-pay | Admitting: Registered Nurse

## 2019-07-21 ENCOUNTER — Telehealth: Payer: Self-pay | Admitting: Registered Nurse

## 2019-07-21 DIAGNOSIS — R509 Fever, unspecified: Secondary | ICD-10-CM

## 2019-07-21 NOTE — Telephone Encounter (Signed)
Mal [employer] notified us employee turned away at check point this am for temp 104 with thermal scanner; patient had heater on was told to shut off and waited ten minutes to repeat temp was still 103 so sent home per Covid protocol.  Patient contacted via telephone and she reported drive home approximately 23 minutes she did not eat or drink in car and took oral temp 37.3C.  Patient reported she only took one dose amoxicillin on Tuesday 29 Sep and has not taken any further doses.  Has felt well except for ear pain and some watery discharge from ear denied n/v/d/chills/fever/sweats/cough/runny nose/loss of taste/smell, dyspnea, sore throat or abdomen pain.  Patient asking if she will get paid for today and let go from her job.  Discussed with patient I do not work in [place] and she should contact her agency rep along with [employer] rep.  Discussed that eating/drinking/brushing teeth in the 30 minutes prior to oral temp check can affect results.  Patient to take her temp upon awakening if higher than 37.7 she is to stay home tomorrow am and call her supervisor and notify them.  Discussed to take her amoxicillin as soon as she gets home today; again prior to bedtime and then in am.  After 4 doses she shouldn't be running a fever.  Discussed it is possible to run a fever with known ear infection but due to covid 19 pandemic must be fever free off antipyretics 24 hours to return to work. If still running a fever and symptoms unchanged I recommend she quarantine at home and she do Covid 19 testing on Monday 5 Oct drive thru and quarantine at home for total of 14 days.  If new symptoms contact clinic/PCM for further triage/eval and treatment.  ER red flags syncope, face/lips turning blue, confusion.  Patient verbalized understanding information/instructions, agreed with plan of care and had no further questions at this time.

## 2019-07-28 ENCOUNTER — Ambulatory Visit: Payer: Self-pay

## 2019-07-28 ENCOUNTER — Other Ambulatory Visit: Payer: Self-pay

## 2019-07-28 ENCOUNTER — Other Ambulatory Visit: Payer: Self-pay | Admitting: Internal Medicine

## 2019-07-28 DIAGNOSIS — M79644 Pain in right finger(s): Secondary | ICD-10-CM

## 2019-10-18 ENCOUNTER — Encounter (HOSPITAL_BASED_OUTPATIENT_CLINIC_OR_DEPARTMENT_OTHER): Payer: Self-pay | Admitting: Emergency Medicine

## 2019-10-18 ENCOUNTER — Emergency Department (HOSPITAL_BASED_OUTPATIENT_CLINIC_OR_DEPARTMENT_OTHER)
Admission: EM | Admit: 2019-10-18 | Discharge: 2019-10-18 | Disposition: A | Discharge status: Home / Self Care | Payer: Self-pay | Attending: Emergency Medicine | Admitting: Emergency Medicine

## 2019-10-18 ENCOUNTER — Other Ambulatory Visit: Payer: Self-pay

## 2019-10-18 DIAGNOSIS — N926 Irregular menstruation, unspecified: Secondary | ICD-10-CM

## 2019-10-18 DIAGNOSIS — F17210 Nicotine dependence, cigarettes, uncomplicated: Secondary | ICD-10-CM | POA: Insufficient documentation

## 2019-10-18 DIAGNOSIS — J45909 Unspecified asthma, uncomplicated: Secondary | ICD-10-CM | POA: Insufficient documentation

## 2019-10-18 LAB — URINALYSIS, ROUTINE W REFLEX MICROSCOPIC
Bilirubin Urine: NEGATIVE
Glucose, UA: NEGATIVE mg/dL
Ketones, ur: NEGATIVE mg/dL
Leukocytes,Ua: NEGATIVE
Nitrite: NEGATIVE
Protein, ur: NEGATIVE mg/dL
Specific Gravity, Urine: 1.02 (ref 1.005–1.030)
pH: 6 (ref 5.0–8.0)

## 2019-10-18 LAB — URINALYSIS, MICROSCOPIC (REFLEX)

## 2019-10-18 LAB — PREGNANCY, URINE: Preg Test, Ur: NEGATIVE

## 2019-10-18 NOTE — ED Triage Notes (Addendum)
Pt states she has had two periods in a month, having mood swings, hair loss, and growing whiskers on her chin  Pt states she just does not feel right   Pt added that her weight has been fluctuating and she feels hungry all the time

## 2019-10-18 NOTE — ED Provider Notes (Signed)
[facility] EMERGENCY DEPARTMENT Provider Note   CSN: [id number] Arrival date & time: 10/18/19  0128     History Chief Complaint  Patient presents with  . abnormal periods    Regina Miller is a 23 y.o. female.  HPI Patient presenting for abnormal periods.  She reports has been having mood swings, hair loss, but also having some facial hair.  She also reports weight gain.  This is been ongoing for the past several months.  No abdominal pain at this time.  No active bleeding at this time.  She is concerned she may have an ovarian or hormone problem    Past Medical History:  Diagnosis Date  . Acid reflux   . Anxiety   . Asthma   . Attention deficit disorder   . Heart murmur    Reports she had a heart murmur as a baby that has been evaluated. She is not currently followed by a cardiologist.    Patient Active Problem List   Diagnosis Date Noted  . Morbid obesity (HCC) 07/19/2019  . First trimester pregnancy 07/26/2017  . Anxiety 12/22/2013  . Eczema 12/22/2013  . Body mass index, pediatric, greater than or 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 age 21/02/2014  . Acne 04/26/2012  . Insomnia 07/06/2011  . GERD 09/19/2008  . ATTENTION DEFICIT, W/HYPERACTIVITY 12/17/2006    Past Surgical History:  Procedure Laterality Date  . TONSILLECTOMY    . TYMPANOSTOMY TUBE PLACEMENT Bilateral      OB History    Gravida  1   Para      Term      Preterm      AB      Living        SAB      TAB      Ectopic      Multiple      Live Births              Family History  Problem Relation Age of Onset  . Psoriasis Mother   . Hypertension Father   . Eczema Father   . Diabetes Father   . Heart disease Paternal Grandmother   . Cancer Maternal Grandmother     Social History   Tobacco Use  . Smoking status: Current Every Day Smoker    Packs/day: 1.00    Types: Cigarettes  . Smokeless tobacco: Never Used  . Tobacco comment: Parents smoke around her    Substance Use Topics  . Alcohol use: Yes    Comment: occ  . Drug use: Not Currently    Types: Marijuana    Home Medications Prior to Admission medications   Medication Sig Start Date End Date Taking? Authorizing Provider  sodium chloride (OCEAN) 0.65 % SOLN nasal spray Place 2 sprays into both nostrils every 2 (two) hours while awake. 07/19/19 08/18/19  Betancourt, Jarold Song, NP  diphenhydrAMINE (BENADRYL) 25 mg capsule Take 1 capsule (25 mg total) by mouth every 8 (eight) hours as needed for allergies. 07/19/19 10/18/19  Betancourt, Jarold Song, NP  loratadine (CLARITIN) 10 MG tablet Take 1 tablet (10 mg total) by mouth daily. 07/19/19 10/18/19  BetancourtJarold Song, NP    Allergies    Other  Review of Systems   Review of Systems  Constitutional: Positive for unexpected weight change. Negative for fever.  Gastrointestinal: Negative for vomiting.    Physical Exam Updated Vital Signs BP (!) 132/97 (BP Location: Left Arm)   Pulse 85   Temp  98.5 F (36.9 C) (Oral)   Resp 20   Ht 1.575 m (5\' 2" )   Wt 99.7 kg   SpO2 100%   BMI 40.22 kg/m   Physical Exam  CONSTITUTIONAL: Well developed/well nourished HEAD: Normocephalic/atraumatic EYES: EOMI/PERRL ENMT: Mucous membranes moist, hirsutism NECK: supple no meningeal signs CV: S1/S2 noted, no murmurs/rubs/gallops noted LUNGS: Lungs are clear to auscultation bilaterally, no apparent distress ABDOMEN: soft, nontender NEURO: Pt is awake/alert/appropriate, moves all extremitiesx4.  No facial droop.   EXTREMITIES:full ROM SKIN: warm, color normal PSYCH: no abnormalities of mood noted, alert and oriented to situation  ED Results / Procedures / Treatments   Labs (all labs ordered are listed, but only abnormal results are displayed) Labs Reviewed  URINALYSIS, ROUTINE W REFLEX MICROSCOPIC - Abnormal; Notable for the following components:      Result Value   APPearance HAZY (*)    Hgb urine dipstick MODERATE (*)    All other components  within normal limits  URINALYSIS, MICROSCOPIC (REFLEX) - Abnormal; Notable for the following components:   Bacteria, UA MANY (*)    All other components within normal limits  PREGNANCY, URINE    EKG None  Radiology No results found.  Procedures Procedures   Medications Ordered in ED Medications - No data to display  ED Course  I have reviewed the triage vital signs and the nursing notes.  Pertinent labs results that were available during my care of the patient were reviewed by me and considered in my medical decision making (see chart for details).    MDM Rules/Calculators/A&P                      Patient with likely PCOS.  She would need to have an outpatient evaluation by gynecologist.  She understands this.  Given referrals for outpatient management.  Patient otherwise stable at this time.  Denies any active pain or bleeding at this time.  Urinalysis likely consistent with contaminant Final Clinical Impression(s) / ED Diagnoses Final diagnoses:  Irregular periods    Rx / DC Orders ED Discharge Orders    None       Ripley Fraise, MD 10/18/19 [phone]

## 2019-10-18 NOTE — ED Notes (Signed)
Pt left prior to receiving d/c instructions. Unable to obtain VS.

## 2019-11-02 ENCOUNTER — Other Ambulatory Visit: Payer: Self-pay

## 2019-11-02 ENCOUNTER — Encounter: Payer: Self-pay | Admitting: Family Medicine

## 2019-11-02 ENCOUNTER — Emergency Department (INDEPENDENT_AMBULATORY_CARE_PROVIDER_SITE_OTHER)
Admission: EM | Admit: 2019-11-02 | Discharge: 2019-11-02 | Disposition: A | Discharge status: Home / Self Care | Payer: Self-pay | Source: Home / Self Care

## 2019-11-02 DIAGNOSIS — H00019 Hordeolum externum unspecified eye, unspecified eyelid: Secondary | ICD-10-CM

## 2019-11-02 MED ORDER — DOXYCYCLINE HYCLATE 100 MG PO TABS: 100.0000 mg | ORAL_TABLET | Freq: Two times a day (BID) | ORAL | 0 refills | Status: DC

## 2019-11-02 NOTE — ED Triage Notes (Signed)
Ears have been itching and draining for about 1 week.

## 2019-11-02 NOTE — ED Provider Notes (Signed)
[facility]    CSN: [id number] Arrival date & time: 11/02/19  1309      History   Chief Complaint Chief Complaint  Patient presents with  . Eye Problem    HPI Regina Miller is a 24 y.o. female.   Initial [facility] visit for this 24 yo woman with eye problem.  Yesterday left eye started burning in the corner of eye.  Had drainage from eye, and left eye lid swollen.  Today right eye is burning and draining.  Works at [employer].     Past Medical History:  Diagnosis Date  . Acid reflux   . Anxiety   . Asthma   . Attention deficit disorder   . Heart murmur    Reports she had a heart murmur as a baby that has been evaluated. She is not currently followed by a cardiologist.    Patient Active Problem List   Diagnosis Date Noted  . Morbid obesity (HCC) 07/19/2019  . First trimester pregnancy 07/26/2017  . Anxiety 12/22/2013  . Eczema 12/22/2013  . Body mass index, pediatric, greater than or 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 age 67/02/2014  . Acne 04/26/2012  . Insomnia 07/06/2011  . GERD 09/19/2008  . ATTENTION DEFICIT, W/HYPERACTIVITY 12/17/2006    Past Surgical History:  Procedure Laterality Date  . TONSILLECTOMY    . TYMPANOSTOMY TUBE PLACEMENT Bilateral     OB History    Gravida  1   Para      Term      Preterm      AB      Living        SAB      TAB      Ectopic      Multiple      Live Births               Home Medications    Prior to Admission medications   Medication Sig Start Date End Date Taking? Authorizing Provider  doxycycline (VIBRA-TABS) 100 MG tablet Take 1 tablet (100 mg total) by mouth 2 (two) times daily. 11/02/19   Elvina Sidle, MD  sodium chloride (OCEAN) 0.65 % SOLN nasal spray Place 2 sprays into both nostrils every 2 (two) hours while awake. 07/19/19 08/18/19  Betancourt, Jarold Song, NP  diphenhydrAMINE (BENADRYL) 25 mg capsule Take 1 capsule (25 mg total) by mouth every 8 (eight) hours as needed for  allergies. 07/19/19 10/18/19  Betancourt, Jarold Song, NP  loratadine (CLARITIN) 10 MG tablet Take 1 tablet (10 mg total) by mouth daily. 07/19/19 10/18/19  Betancourt, Jarold Song, NP    Family History Family History  Problem Relation Age of Onset  . Psoriasis Mother   . Hypertension Father   . Eczema Father   . Diabetes Father   . Heart disease Paternal Grandmother   . Cancer Maternal Grandmother     Social History Social History   Tobacco Use  . Smoking status: Current Every Day Smoker    Packs/day: 1.00    Types: Cigarettes  . Smokeless tobacco: Never Used  . Tobacco comment: Parents smoke around her  Substance Use Topics  . Alcohol use: Yes    Comment: occ  . Drug use: Not Currently    Types: Marijuana     Allergies   Other   Review of Systems Review of Systems  Eyes: Positive for pain, discharge and redness.  All other systems reviewed and are negative.    Physical Exam Triage  Vital Signs ED Triage Vitals  Enc Vitals Group     BP 11/02/19 1336 123/78     Pulse Rate 11/02/19 1336 81     Resp 11/02/19 1336 20     Temp 11/02/19 1336 98 F (36.7 C)     Temp Source 11/02/19 1336 Oral     SpO2 11/02/19 1336 99 %     Weight 11/02/19 1337 216 lb (98 kg)     Height 11/02/19 1337 5\' 2"  (1.575 m)     Head Circumference --      Peak Flow --      Pain Score 11/02/19 1336 5     Pain Loc --      Pain Edu? --      Excl. in [place]? --    No data found.  Updated Vital Signs BP 123/78 (BP Location: Right Arm)   Pulse 81   Temp 98 F (36.7 C) (Oral)   Resp 20   Ht 5\' 2"  (1.575 m)   Wt 98 kg   LMP 08/02/2019   SpO2 99%   BMI 39.51 kg/m   Visual Acuity Right Eye Distance: 20/50 Left Eye Distance: 20/30 Bilateral Distance: 20/30   Physical Exam Vitals and nursing note reviewed.  Constitutional:      Appearance: Normal appearance. She is obese.  HENT:     Right Ear: Tympanic membrane normal.     Left Ear: Tympanic membrane normal.     Ears:     Comments:  Bilateral white healed scars on each TM    Nose: Nose normal.     Mouth/Throat:     Mouth: Mucous membranes are moist.  Eyes:     Extraocular Movements: Extraocular movements intact.     Conjunctiva/sclera: Conjunctivae normal.     Pupils: Pupils are equal, round, and reactive to light.     Comments: Mild bilateral upper lid swelling  Pulmonary:     Effort: Pulmonary effort is normal.  Musculoskeletal:        General: Normal range of motion.     Cervical back: Normal range of motion and neck supple.  Skin:    General: Skin is warm.  Neurological:     General: No focal deficit present.     Mental Status: She is alert.  Psychiatric:        Mood and Affect: Mood normal.      UC Treatments / Results  Labs (all labs ordered are listed, but only abnormal results are displayed) Labs Reviewed - No data to display  EKG   Radiology No results found.  Procedures Procedures (including critical care time)  Medications Ordered in UC Medications - No data to display  Initial Impression / Assessment and Plan / UC Course  I have reviewed the triage vital signs and the nursing notes.  Pertinent labs & imaging results that were available during my care of the patient were reviewed by me and considered in my medical decision making (see chart for details).    Final Clinical Impressions(s) / UC Diagnoses   Final diagnoses:  Hordeolum externum, unspecified laterality   Discharge Instructions   None    ED Prescriptions    Medication Sig Dispense Auth. Provider   doxycycline (VIBRA-TABS) 100 MG tablet Take 1 tablet (100 mg total) by mouth 2 (two) times daily. 14 tablet Robyn Haber, MD     I have reviewed the PDMP during this encounter.   Robyn Haber, MD 11/02/19 1352

## 2019-11-02 NOTE — ED Triage Notes (Signed)
Yesterday left eye started burning in the corner of eye.  Had drainage from eye, and left eye lid swollen.  Today right eye is burning and draining.

## 2019-11-14 ENCOUNTER — Ambulatory Visit: Payer: Medicaid Other | Admitting: Family Medicine

## 2020-04-17 ENCOUNTER — Other Ambulatory Visit: Payer: Self-pay

## 2020-04-17 ENCOUNTER — Encounter (HOSPITAL_COMMUNITY): Payer: Self-pay | Admitting: Emergency Medicine

## 2020-04-17 ENCOUNTER — Ambulatory Visit (HOSPITAL_COMMUNITY)
Admission: EM | Admit: 2020-04-17 | Discharge: 2020-04-17 | Disposition: A | Discharge status: Home / Self Care | Payer: Self-pay | Attending: Urgent Care | Admitting: Urgent Care

## 2020-04-17 DIAGNOSIS — Z3202 Encounter for pregnancy test, result negative: Secondary | ICD-10-CM

## 2020-04-17 DIAGNOSIS — Z202 Contact with and (suspected) exposure to infections with a predominantly sexual mode of transmission: Secondary | ICD-10-CM | POA: Insufficient documentation

## 2020-04-17 DIAGNOSIS — N898 Other specified noninflammatory disorders of vagina: Secondary | ICD-10-CM | POA: Insufficient documentation

## 2020-04-17 LAB — POC URINE PREG, ED: Preg Test, Ur: NEGATIVE

## 2020-04-17 MED ORDER — FLUCONAZOLE 150 MG PO TABS
150.0000 mg | ORAL_TABLET | ORAL | 0 refills | Status: DC
Start: 2020-04-17 — End: 2020-06-08

## 2020-04-17 MED ORDER — AZITHROMYCIN 250 MG PO TABS
1000.0000 mg | ORAL_TABLET | Freq: Once | ORAL | Status: AC
  Administered 2020-04-17: 1000 mg via ORAL

## 2020-04-17 MED ORDER — AZITHROMYCIN 250 MG PO TABS
ORAL_TABLET | ORAL | Status: AC
  Filled 2020-04-17: qty 4

## 2020-04-17 NOTE — ED Triage Notes (Signed)
Pt states partner recently tested postive for chylamydia. She has had vaginal itching and discharge.

## 2020-04-17 NOTE — ED Provider Notes (Signed)
[facility]   MRN: [id number] DOB: June 17, 1996  Subjective:   Regina Miller is a 24 y.o. female presenting for exposure to chlamydia.  Patient has since developed vaginal discharge and itching.  She wants treatment in the clinic, does not want prescription sent to her pharmacy due to financial burden.  No current facility-administered medications for this encounter.  Current Outpatient Medications:  .  sodium chloride (OCEAN) 0.65 % SOLN nasal spray, Place 2 sprays into both nostrils every 2 (two) hours while awake., Disp: , Rfl: 0   Allergies  Allergen Reactions  . Other Hives    Nair hair removal    Past Medical History:  Diagnosis Date  . Acid reflux   . Anxiety   . Asthma   . Attention deficit disorder   . Heart murmur    Reports she had a heart murmur as a baby that has been evaluated. She is not currently followed by a cardiologist.     Past Surgical History:  Procedure Laterality Date  . TONSILLECTOMY    . TYMPANOSTOMY TUBE PLACEMENT Bilateral     Family History  Problem Relation Age of Onset  . Psoriasis Mother   . Hypertension Father   . Eczema Father   . Diabetes Father   . Heart disease Paternal Grandmother   . Cancer Maternal Grandmother     Social History   Tobacco Use  . Smoking status: Current Every Day Smoker    Packs/day: 1.00    Types: Cigarettes  . Smokeless tobacco: Never Used  . Tobacco comment: Parents smoke around her  Vaping Use  . Vaping Use: Never used  Substance Use Topics  . Alcohol use: Yes    Comment: occ  . Drug use: Not Currently    Types: Marijuana    ROS   Objective:   Vitals: BP 110/75 (BP Location: Right Arm)   Pulse 68   Temp 98.4 F (36.9 C) (Oral)   Resp 16   SpO2 100%   Physical Exam Constitutional:      General: She is not in acute distress.    Appearance: Normal appearance. She is well-developed. She is not ill-appearing.  HENT:     Head: Normocephalic and atraumatic.     Nose:  Nose normal.     Mouth/Throat:     Mouth: Mucous membranes are moist.     Pharynx: Oropharynx is clear.  Eyes:     General: No scleral icterus.       Right eye: No discharge.        Left eye: No discharge.     Extraocular Movements: Extraocular movements intact.     Conjunctiva/sclera: Conjunctivae normal.     Pupils: Pupils are equal, round, and reactive to light.  Cardiovascular:     Rate and Rhythm: Normal rate.  Pulmonary:     Effort: Pulmonary effort is normal.  Skin:    General: Skin is warm and dry.  Neurological:     General: No focal deficit present.     Mental Status: She is alert and oriented to person, place, and time.  Psychiatric:        Mood and Affect: Mood normal.        Behavior: Behavior normal.        Thought Content: Thought content normal.        Judgment: Judgment normal.     Results for orders placed or performed during the hospital encounter of 04/17/20 (from the past 24 hour(s))  POC urine pregnancy     Status: None   Collection Time: 04/17/20  3:31 PM  Result Value Ref Range   Preg Test, Ur NEGATIVE NEGATIVE    Assessment and Plan :   PDMP not reviewed this encounter.  1. STD exposure   2. Exposure to chlamydia   3. Vaginal itching     Patient given azithromycin in clinic. Diflucan for yeast vaginitis. Labs pending. Counseled patient on potential for adverse effects with medications prescribed/recommended today, ER and return-to-clinic precautions discussed, patient verbalized understanding.    Wallis Bamberg, [place] 04/17/20 1549

## 2020-04-18 ENCOUNTER — Telehealth (HOSPITAL_COMMUNITY): Payer: Self-pay | Admitting: Orthopedic Surgery

## 2020-04-18 LAB — CERVICOVAGINAL ANCILLARY ONLY
Bacterial Vaginitis (gardnerella): NEGATIVE
Candida Glabrata: NEGATIVE
Candida Vaginitis: NEGATIVE
Chlamydia: NEGATIVE
Comment: NEGATIVE
Comment: NEGATIVE
Comment: NEGATIVE
Comment: NEGATIVE
Comment: NEGATIVE
Comment: NORMAL
Neisseria Gonorrhea: NEGATIVE
Trichomonas: POSITIVE — AB

## 2020-04-18 MED ORDER — METRONIDAZOLE 500 MG PO TABS: 500.0000 mg | ORAL_TABLET | Freq: Two times a day (BID) | ORAL | 0 refills | Status: DC

## 2020-06-08 ENCOUNTER — Encounter (HOSPITAL_COMMUNITY): Payer: Self-pay

## 2020-06-08 ENCOUNTER — Ambulatory Visit (HOSPITAL_COMMUNITY)
Admission: EM | Admit: 2020-06-08 | Discharge: 2020-06-08 | Disposition: A | Discharge status: Home / Self Care | Payer: Medicaid Other | Attending: Family Medicine | Admitting: Family Medicine

## 2020-06-08 ENCOUNTER — Other Ambulatory Visit: Payer: Self-pay

## 2020-06-08 DIAGNOSIS — N921 Excessive and frequent menstruation with irregular cycle: Secondary | ICD-10-CM | POA: Insufficient documentation

## 2020-06-08 DIAGNOSIS — Z3202 Encounter for pregnancy test, result negative: Secondary | ICD-10-CM | POA: Diagnosis not present

## 2020-06-08 LAB — POC URINE PREG, ED: Preg Test, Ur: NEGATIVE

## 2020-06-08 MED ORDER — NAPROXEN 500 MG PO TABS
500.0000 mg | ORAL_TABLET | Freq: Two times a day (BID) | ORAL | 0 refills | Status: DC
Start: 2020-06-08 — End: 2022-09-13

## 2020-06-08 NOTE — Discharge Instructions (Signed)
We are re-screening for STDs Please use naproxen twice daily as needed for pain/cramping Please monitor for resolution of bleeding over the next week Follow-up with OB/GYN for further evaluation of abnormal bleeding/heavy bleeding/irregular cycles If developing persistent heavy bleeding, dizziness, lightheadedness, significant fatigue please follow-up for reevaluation

## 2020-06-08 NOTE — ED Triage Notes (Signed)
Pt presents with larger abnormal vaginal bleeding during menstrual today.

## 2020-06-09 NOTE — ED Provider Notes (Signed)
[facility]    CSN: [id number] Arrival date & time: 06/08/20  1436      History   Chief Complaint Chief Complaint  Patient presents with   Abnormal Vaginal Bleeding    HPI Regina Miller is a 24 y.o. female presenting today for evaluation of heavy menstrual bleeding.  Patient began her menstrual cycle yesterday, notes bleeding today has been very heavy and needing to change super plus tampon every 1.5 hours.  Typically will have heavy cycle as in the first few days of her menstrual cycle, but reports this is heavier than normal.  She has had some cramping as well.  She is not on any birth control.  Last menstrual cycle was approximately 2 months ago.  Typically will have irregular cycles.  She is concerned about possible PCOS as she has also reported some weight gain as well as developing chin hair.  The symptoms have been for approximately 1 year.  She reports recent STD screening approximately 1 month ago and was tested positive for trichomonas.  Reports not being sexually active since.  Denies urinary symptoms.  HPI  Past Medical History:  Diagnosis Date   Acid reflux    Anxiety    Asthma    Attention deficit disorder    Heart murmur    Reports she had a heart murmur as a baby that has been evaluated. She is not currently followed by a cardiologist.    Patient Active Problem List   Diagnosis Date Noted   Morbid obesity (HCC) 07/19/2019   First trimester pregnancy 07/26/2017   Anxiety 12/22/2013   Eczema 12/22/2013   Body mass index, pediatric, greater than or 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 age 76/02/2014   Acne 04/26/2012   Insomnia 07/06/2011   GERD 09/19/2008   ATTENTION DEFICIT, W/HYPERACTIVITY 12/17/2006    Past Surgical History:  Procedure Laterality Date   TONSILLECTOMY     TYMPANOSTOMY TUBE PLACEMENT Bilateral     OB History    Gravida  1   Para      Term      Preterm      AB      Living        SAB      TAB        Ectopic      Multiple      Live Births               Home Medications    Prior to Admission medications   Medication Sig Start Date End Date Taking? Authorizing Provider  naproxen (NAPROSYN) 500 MG tablet Take 1 tablet (500 mg total) by mouth 2 (two) times daily. 06/08/20   Hanaan Gancarz C, PA-C  sodium chloride (OCEAN) 0.65 % SOLN nasal spray Place 2 sprays into both nostrils every 2 (two) hours while awake. 07/19/19 08/18/19  Betancourt, Jarold Song, NP  diphenhydrAMINE (BENADRYL) 25 mg capsule Take 1 capsule (25 mg total) by mouth every 8 (eight) hours as needed for allergies. 07/19/19 10/18/19  Betancourt, Jarold Song, NP  loratadine (CLARITIN) 10 MG tablet Take 1 tablet (10 mg total) by mouth daily. 07/19/19 10/18/19  Betancourt, Jarold Song, NP    Family History Family History  Problem Relation Age of Onset   Psoriasis Mother    Hypertension Father    Eczema Father    Diabetes Father    Heart disease Paternal Grandmother    Cancer Maternal Grandmother     Social History Social History  Tobacco Use   Smoking status: Current Every Day Smoker    Packs/day: 1.00    Types: Cigarettes   Smokeless tobacco: Never Used   Tobacco comment: Parents smoke around her  Vaping Use   Vaping Use: Never used  Substance Use Topics   Alcohol use: Yes    Comment: occ   Drug use: Not Currently    Types: Marijuana     Allergies   Other   Review of Systems Review of Systems  Constitutional: Negative for fever.  Respiratory: Negative for shortness of breath.   Cardiovascular: Negative for chest pain.  Gastrointestinal: Negative for abdominal pain, diarrhea, nausea and vomiting.  Genitourinary: Positive for menstrual problem and vaginal bleeding. Negative for dysuria, flank pain, genital sores, hematuria, vaginal discharge and vaginal pain.  Musculoskeletal: Negative for back pain.  Skin: Negative for rash.  Neurological: Negative for dizziness, light-headedness and  headaches.     Physical Exam Triage Vital Signs ED Triage Vitals  Enc Vitals Group     BP 06/08/20 1523 120/70     Pulse Rate 06/08/20 1523 88     Resp 06/08/20 1523 18     Temp 06/08/20 1523 98.6 F (37 C)     Temp Source 06/08/20 1523 Oral     SpO2 06/08/20 1523 100 %     Weight --      Height --      Head Circumference --      Peak Flow --      Pain Score 06/08/20 1521 4     Pain Loc --      Pain Edu? --      Excl. in [place]? --    No data found.  Updated Vital Signs BP 114/69 (BP Location: Left Arm)    Pulse 97    Temp 98.9 F (37.2 C) (Oral)    Resp 16    LMP 06/07/2020    SpO2 99%   Visual Acuity Right Eye Distance:   Left Eye Distance:   Bilateral Distance:    Right Eye Near:   Left Eye Near:    Bilateral Near:     Physical Exam Vitals and nursing note reviewed.  Constitutional:      Appearance: She is well-developed.     Comments: No acute distress  HENT:     Head: Normocephalic and atraumatic.     Nose: Nose normal.  Eyes:     Conjunctiva/sclera: Conjunctivae normal.  Cardiovascular:     Rate and Rhythm: Normal rate.  Pulmonary:     Effort: Pulmonary effort is normal. No respiratory distress.     Comments: Breathing comfortably at rest, CTABL, no wheezing, rales or other adventitious sounds auscultated Abdominal:     General: There is no distension.     Comments: Soft, nondistended, nontender light deep palpation throughout abdomen  Genitourinary:    Comments: declined Musculoskeletal:        General: Normal range of motion.     Cervical back: Neck supple.  Skin:    General: Skin is warm and dry.  Neurological:     Mental Status: She is alert and oriented to person, place, and time.      UC Treatments / Results  Labs (all labs ordered are listed, but only abnormal results are displayed) Labs Reviewed  POC URINE PREG, ED  CERVICOVAGINAL ANCILLARY ONLY    EKG   Radiology No results found.  Procedures Procedures (including  critical care time)  Medications Ordered in UC  Medications - No data to display  Initial Impression / Assessment and Plan / UC Course  I have reviewed the triage vital signs and the nursing notes.  Pertinent labs & imaging results that were available during my care of the patient were reviewed by me and considered in my medical decision making (see chart for details).     Pregnancy test negative, STD screening pending.  Advised to continue to monitor heavy cycles this week and monitor for gradual resolution and flow as she would expect with a typical cycle.  Low concern for drop in hemoglobin at this time given only day 2 of bleeding.  Discussed concerns around PCOS, discussed possible thyroid evaluation and recommended to follow-up with OB/GYN for further evaluation of this as well as irregular cycles, declined request to initiate on birth control, recommended seeing OB/GYN for this.  Discussed strict return precautions. Patient verbalized understanding and is agreeable with plan.  Final Clinical Impressions(s) / UC Diagnoses   Final diagnoses:  Menorrhagia with irregular cycle     Discharge Instructions     We are re-screening for STDs Please use naproxen twice daily as needed for pain/cramping Please monitor for resolution of bleeding over the next week Follow-up with OB/GYN for further evaluation of abnormal bleeding/heavy bleeding/irregular cycles If developing persistent heavy bleeding, dizziness, lightheadedness, significant fatigue please follow-up for reevaluation     ED Prescriptions    Medication Sig Dispense Auth. Provider   naproxen (NAPROSYN) 500 MG tablet Take 1 tablet (500 mg total) by mouth 2 (two) times daily. 30 tablet Natasha Burda, [place] C, PA-C     PDMP not reviewed this encounter.   Lew Dawes, PA-C 06/09/20 1015

## 2020-06-11 LAB — CERVICOVAGINAL ANCILLARY ONLY
Bacterial Vaginitis (gardnerella): NEGATIVE
Candida Glabrata: NEGATIVE
Candida Vaginitis: NEGATIVE
Chlamydia: NEGATIVE
Comment: NEGATIVE
Comment: NEGATIVE
Comment: NEGATIVE
Comment: NEGATIVE
Comment: NEGATIVE
Comment: NORMAL
Neisseria Gonorrhea: NEGATIVE
Trichomonas: NEGATIVE

## 2021-01-02 ENCOUNTER — Ambulatory Visit (HOSPITAL_COMMUNITY)
Admission: EM | Admit: 2021-01-02 | Discharge: 2021-01-02 | Disposition: A | Discharge status: Home / Self Care | Payer: Self-pay | Attending: Emergency Medicine | Admitting: Emergency Medicine

## 2021-01-02 ENCOUNTER — Encounter (HOSPITAL_COMMUNITY): Payer: Self-pay

## 2021-01-02 ENCOUNTER — Other Ambulatory Visit: Payer: Self-pay

## 2021-01-02 DIAGNOSIS — K029 Dental caries, unspecified: Secondary | ICD-10-CM

## 2021-01-02 MED ORDER — IBUPROFEN 800 MG PO TABS
800.0000 mg | ORAL_TABLET | Freq: Three times a day (TID) | ORAL | 0 refills | Status: DC | PRN
Start: 2021-01-02 — End: 2022-09-13

## 2021-01-02 MED ORDER — AMOXICILLIN 875 MG PO TABS
875.0000 mg | ORAL_TABLET | Freq: Two times a day (BID) | ORAL | 0 refills | Status: AC
Start: 2021-01-02 — End: 2021-01-09

## 2021-01-02 NOTE — ED Provider Notes (Signed)
[facility]    CSN: [id number] Arrival date & time: 01/02/21  1011      History   Chief Complaint Chief Complaint  Patient presents with  . Dental Pain    HPI Regina Miller is a 25 y.o. female.   Patient presents with right lower tooth ache x2 days.  She has not seen a dentist.  She denies fever, chills, difficulty swallowing, or other symptoms.  Treatment attempted with naproxen and tramadol.  Her medical history includes asthma, acid reflux, insomnia, ADHD, anxiety, morbid obesity.  The history is provided by the patient and medical records.    Past Medical History:  Diagnosis Date  . Acid reflux   . Anxiety   . Asthma   . Attention deficit disorder   . Heart murmur    Reports she had a heart murmur as a baby that has been evaluated. She is not currently followed by a cardiologist.    Patient Active Problem List   Diagnosis Date Noted  . Morbid obesity (HCC) 07/19/2019  . First trimester pregnancy 07/26/2017  . Anxiety 12/22/2013  . Eczema 12/22/2013  . Body mass index, pediatric, greater than or 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 age 12/22/2013  . Acne 04/26/2012  . Insomnia 07/06/2011  . GERD 09/19/2008  . ATTENTION DEFICIT, W/HYPERACTIVITY 12/17/2006    Past Surgical History:  Procedure Laterality Date  . TONSILLECTOMY    . TYMPANOSTOMY TUBE PLACEMENT Bilateral     OB History    Gravida  1   Para      Term      Preterm      AB      Living        SAB      IAB      Ectopic      Multiple      Live Births               Home Medications    Prior to Admission medications   Medication Sig Start Date End Date Taking? Authorizing Provider  amoxicillin (AMOXIL) 875 MG tablet Take 1 tablet (875 mg total) by mouth 2 (two) times daily for 7 days. 01/02/21 01/09/21 Yes Mickie Bail, NP  ibuprofen (ADVIL) 800 MG tablet Take 1 tablet (800 mg total) by mouth every 8 (eight) hours as needed. 01/02/21  Yes Mickie Bail, NP  naproxen  (NAPROSYN) 500 MG tablet Take 1 tablet (500 mg total) by mouth 2 (two) times daily. 06/08/20   Wieters, Hallie C, PA-C  sodium chloride (OCEAN) 0.65 % SOLN nasal spray Place 2 sprays into both nostrils every 2 (two) hours while awake. 07/19/19 08/18/19  Betancourt, Jarold Song, NP  diphenhydrAMINE (BENADRYL) 25 mg capsule Take 1 capsule (25 mg total) by mouth every 8 (eight) hours as needed for allergies. 07/19/19 10/18/19  Betancourt, Jarold Song, NP  loratadine (CLARITIN) 10 MG tablet Take 1 tablet (10 mg total) by mouth daily. 07/19/19 10/18/19  Betancourt, Jarold Song, NP    Family History Family History  Problem Relation Age of Onset  . Psoriasis Mother   . Hypertension Father   . Eczema Father   . Diabetes Father   . Heart disease Paternal Grandmother   . Cancer Maternal Grandmother     Social History Social History   Tobacco Use  . Smoking status: Current Every Day Smoker    Packs/day: 1.00    Types: Cigarettes  . Smokeless tobacco: Never Used  . Tobacco comment:  Parents smoke around her  Vaping Use  . Vaping Use: Never used  Substance Use Topics  . Alcohol use: Yes    Comment: occ  . Drug use: Not Currently    Types: Marijuana     Allergies   Other   Review of Systems Review of Systems  Constitutional: Negative for chills and fever.  HENT: Positive for dental problem. Negative for ear pain and sore throat.   Eyes: Negative for pain and visual disturbance.  Respiratory: Negative for cough and shortness of breath.   Cardiovascular: Negative for chest pain and palpitations.  Gastrointestinal: Negative for abdominal pain and vomiting.  Genitourinary: Negative for dysuria and hematuria.  Musculoskeletal: Negative for arthralgias and back pain.  Skin: Negative for color change and rash.  Neurological: Negative for seizures and syncope.  All other systems reviewed and are negative.    Physical Exam Triage Vital Signs ED Triage Vitals  Enc Vitals Group     BP      Pulse       Resp      Temp      Temp src      SpO2      Weight      Height      Head Circumference      Peak Flow      Pain Score      Pain Loc      Pain Edu?      Excl. in GC?    No data found.  Updated Vital Signs BP 121/60   Pulse 77   Temp 98 F (36.7 C) (Temporal)   Resp 18   SpO2 100%   Visual Acuity Right Eye Distance:   Left Eye Distance:   Bilateral Distance:    Right Eye Near:   Left Eye Near:    Bilateral Near:     Physical Exam Vitals and nursing note reviewed.  Constitutional:      General: She is not in acute distress.    Appearance: She is well-developed. She is obese. She is not ill-appearing.  HENT:     Head: Normocephalic and atraumatic.     Mouth/Throat:     Mouth: Mucous membranes are moist.     Dentition: Abnormal dentition. Dental tenderness and dental caries present.     Pharynx: Oropharynx is clear.      Comments: Speech clear.  No difficulty swallowing. Eyes:     Conjunctiva/sclera: Conjunctivae normal.  Cardiovascular:     Rate and Rhythm: Normal rate and regular rhythm.     Heart sounds: Normal heart sounds.  Pulmonary:     Effort: Pulmonary effort is normal. No respiratory distress.     Breath sounds: Normal breath sounds.  Abdominal:     Palpations: Abdomen is soft.     Tenderness: There is no abdominal tenderness.  Musculoskeletal:     Cervical back: Neck supple.  Skin:    General: Skin is warm and dry.  Neurological:     General: No focal deficit present.     Mental Status: She is alert and oriented to person, place, and time.     Gait: Gait normal.  Psychiatric:        Mood and Affect: Mood normal.        Behavior: Behavior normal.      UC Treatments / Results  Labs (all labs ordered are listed, but only abnormal results are displayed) Labs Reviewed - No data to display  EKG  Radiology No results found.  Procedures Procedures (including critical care time)  Medications Ordered in UC Medications - No data to  display  Initial Impression / Assessment and Plan / UC Course  I have reviewed the triage vital signs and the nursing notes.  Pertinent labs & imaging results that were available during my care of the patient were reviewed by me and considered in my medical decision making (see chart for details).   Pain due to dental caries.  Treating with amoxicillin and ibuprofen.  Instructed patient to schedule an appointment with a dentist as soon as possible; dental resource guide provided.  Patient agrees to plan of care.   Final Clinical Impressions(s) / UC Diagnoses   Final diagnoses:  Pain due to dental caries     Discharge Instructions     Take the antibiotic as prescribed.    A dental resource guide is attached.  Please call to make an appointment with a dentist as soon as possible.    Go to the emergency department if you have acute worsening symptoms.        ED Prescriptions    Medication Sig Dispense Auth. Provider   amoxicillin (AMOXIL) 875 MG tablet Take 1 tablet (875 mg total) by mouth 2 (two) times daily for 7 days. 14 tablet Wendee Beavers H, NP   ibuprofen (ADVIL) 800 MG tablet Take 1 tablet (800 mg total) by mouth every 8 (eight) hours as needed. 21 tablet Mickie Bail, NP     I have reviewed the PDMP during this encounter.   Mickie Bail, NP 01/02/21 1115

## 2021-01-02 NOTE — ED Triage Notes (Signed)
Pt in with c/o right lower tooth pain that has been going on for 2 days now  Pt has tried tramadol and naproxen no relief

## 2021-01-02 NOTE — Discharge Instructions (Addendum)
Take the antibiotic as prescribed.    A dental resource guide is attached.  Please call to make an appointment with a dentist as soon as possible.    Go to the emergency department if you have acute worsening symptoms.     

## 2021-06-28 ENCOUNTER — Encounter (HOSPITAL_COMMUNITY): Payer: Self-pay | Admitting: Emergency Medicine

## 2021-06-28 ENCOUNTER — Other Ambulatory Visit: Payer: Self-pay

## 2021-06-28 ENCOUNTER — Ambulatory Visit (HOSPITAL_COMMUNITY)
Admission: EM | Admit: 2021-06-28 | Discharge: 2021-06-28 | Disposition: A | Discharge status: Home / Self Care | Payer: Medicaid Other | Attending: Physician Assistant | Admitting: Physician Assistant

## 2021-06-28 DIAGNOSIS — F17210 Nicotine dependence, cigarettes, uncomplicated: Secondary | ICD-10-CM | POA: Insufficient documentation

## 2021-06-28 DIAGNOSIS — Z28310 Unvaccinated for covid-19: Secondary | ICD-10-CM | POA: Insufficient documentation

## 2021-06-28 DIAGNOSIS — Z20822 Contact with and (suspected) exposure to covid-19: Secondary | ICD-10-CM | POA: Insufficient documentation

## 2021-06-28 DIAGNOSIS — J029 Acute pharyngitis, unspecified: Secondary | ICD-10-CM | POA: Insufficient documentation

## 2021-06-28 DIAGNOSIS — J45909 Unspecified asthma, uncomplicated: Secondary | ICD-10-CM | POA: Insufficient documentation

## 2021-06-28 LAB — POCT RAPID STREP A, ED / UC: Streptococcus, Group A Screen (Direct): NEGATIVE

## 2021-06-28 IMAGING — DX DG FINGER THUMB 2+V*R*
3 series · 3 of 3 positions shown · non-contrast
Comparison: None.

CLINICAL DATA: Crush injury of the right thumb

EXAM:
RIGHT THUMB 2+V

[finger pa]
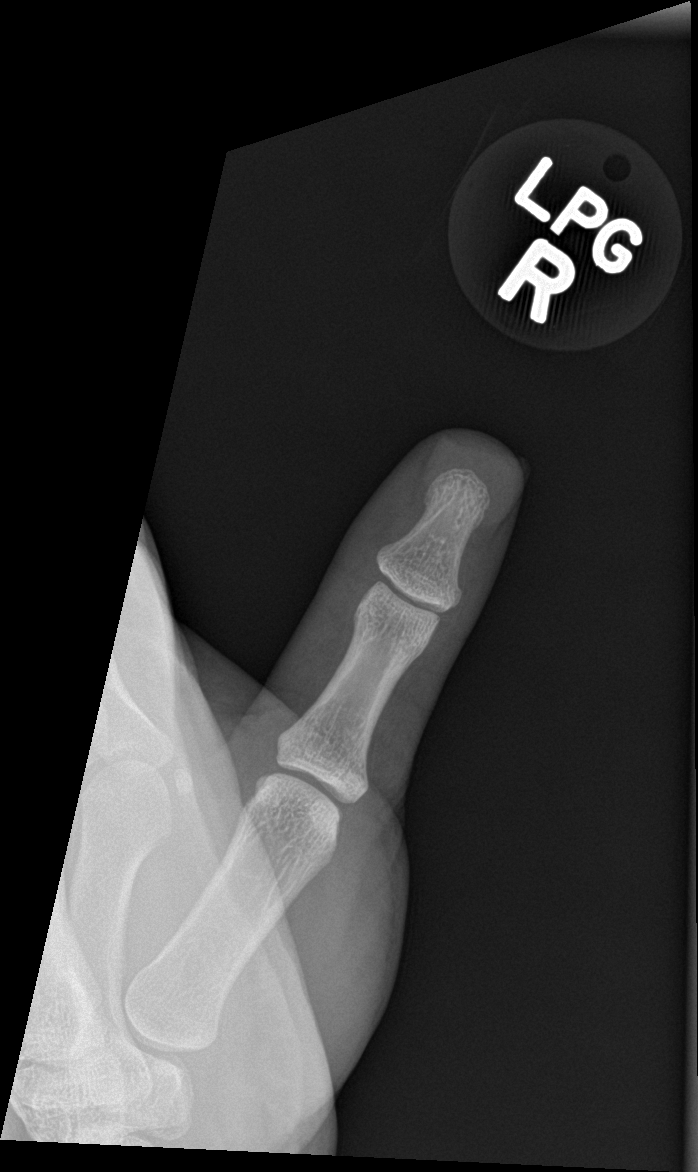

[finger obl]
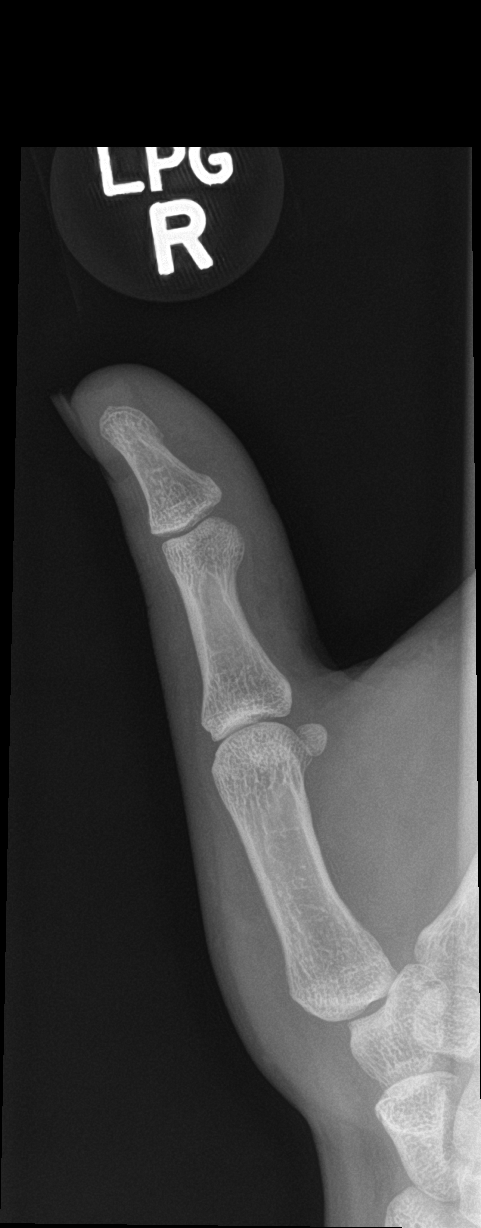

[finger lat]
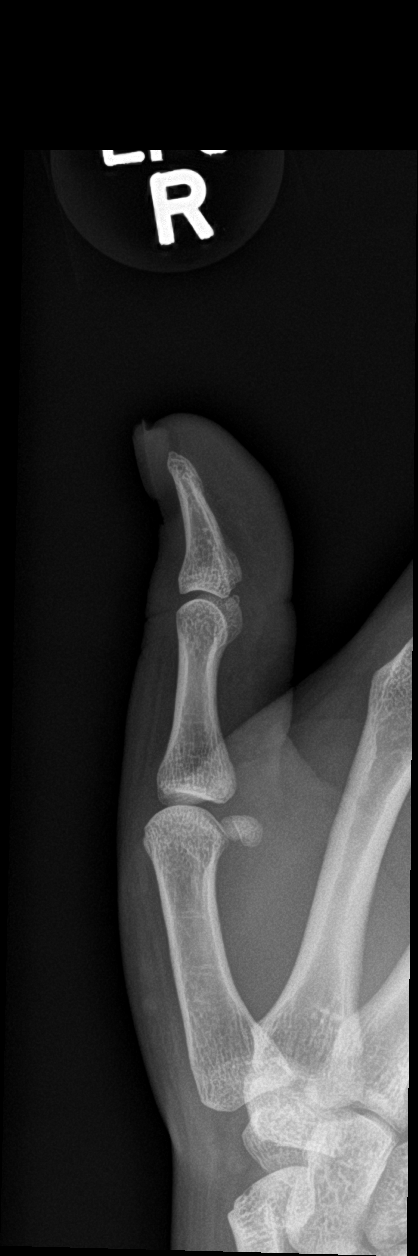

[3 of 3 positions shown; findings below may reference images not displayed]

FINDINGS: There is no evidence of fracture or dislocation. There is no
evidence of arthropathy or other focal bone abnormality. Soft
tissues are unremarkable.
IMPRESSION: Negative.

## 2021-06-28 MED ORDER — ALBUTEROL SULFATE HFA 108 (90 BASE) MCG/ACT IN AERS: 1.0000 | INHALATION_SPRAY | Freq: Four times a day (QID) | RESPIRATORY_TRACT | 1 refills | Status: DC | PRN

## 2021-06-28 NOTE — Discharge Instructions (Addendum)
COVID test pending Recommend salt water gargles, ibuprofen as needed

## 2021-06-28 NOTE — ED Provider Notes (Signed)
[facility]    CSN: [id number] Arrival date & time: 06/28/21  1547      History   Chief Complaint Chief Complaint  Patient presents with   Sore Throat   Skin Irritation     HPI Regina Miller is a 25 y.o. female.   Pt complains of sore throat that started 2-3 days ago.  She reports hoarseness.  Reports waking up a few days ago with a coughing fit.  She has a h/o asthma, but reports she is out of her inhaler.  She denies fever, chills, n/v/d.  She has not had her COVID vaccine.  She is taking nothing for the sx.    Past Medical History:  Diagnosis Date   Acid reflux    Anxiety    Asthma    Attention deficit disorder    Heart murmur    Reports she had a heart murmur as a baby that has been evaluated. She is not currently followed by a cardiologist.    Patient Active Problem List   Diagnosis Date Noted   Morbid obesity (HCC) 07/19/2019   First trimester pregnancy 07/26/2017   Anxiety 12/22/2013   Eczema 12/22/2013   Body mass index, pediatric, greater than or 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 age 74/02/2014   Acne 04/26/2012   Insomnia 07/06/2011   GERD 09/19/2008   ATTENTION DEFICIT, W/HYPERACTIVITY 12/17/2006    Past Surgical History:  Procedure Laterality Date   TONSILLECTOMY     TYMPANOSTOMY TUBE PLACEMENT Bilateral     OB History     Gravida  1   Para      Term      Preterm      AB      Living         SAB      IAB      Ectopic      Multiple      Live Births               Home Medications    Prior to Admission medications   Medication Sig Start Date End Date Taking? Authorizing Provider  ibuprofen (ADVIL) 800 MG tablet Take 1 tablet (800 mg total) by mouth every 8 (eight) hours as needed. 01/02/21   Mickie Bail, NP  naproxen (NAPROSYN) 500 MG tablet Take 1 tablet (500 mg total) by mouth 2 (two) times daily. 06/08/20   Wieters, Hallie C, PA-C  sodium chloride (OCEAN) 0.65 % SOLN nasal spray Place 2 sprays into both  nostrils every 2 (two) hours while awake. 07/19/19 08/18/19  Betancourt, Jarold Song, NP  diphenhydrAMINE (BENADRYL) 25 mg capsule Take 1 capsule (25 mg total) by mouth every 8 (eight) hours as needed for allergies. 07/19/19 10/18/19  Betancourt, Jarold Song, NP  loratadine (CLARITIN) 10 MG tablet Take 1 tablet (10 mg total) by mouth daily. 07/19/19 10/18/19  Betancourt, Jarold Song, NP    Family History Family History  Problem Relation Age of Onset   Psoriasis Mother    Hypertension Father    Eczema Father    Diabetes Father    Heart disease Paternal Grandmother    Cancer Maternal Grandmother     Social History Social History   Tobacco Use   Smoking status: Every Day    Packs/day: 1.00    Types: Cigarettes   Smokeless tobacco: Never   Tobacco comments:    Parents smoke around her  Vaping Use   Vaping Use: Never used  Substance Use  Topics   Alcohol use: Yes    Comment: occ   Drug use: Not Currently    Types: Marijuana     Allergies   Other   Review of Systems Review of Systems  Constitutional:  Negative for chills and fever.  HENT:  Positive for sore throat. Negative for ear pain.   Eyes:  Negative for pain and visual disturbance.  Respiratory:  Negative for cough and shortness of breath.   Cardiovascular:  Negative for chest pain and palpitations.  Gastrointestinal:  Negative for abdominal pain and vomiting.  Genitourinary:  Negative for dysuria and hematuria.  Musculoskeletal:  Negative for arthralgias and back pain.  Skin:  Negative for color change and rash.  Neurological:  Negative for seizures and syncope.  All other systems reviewed and are negative.   Physical Exam Triage Vital Signs ED Triage Vitals  Enc Vitals Group     BP 06/28/21 1749 122/82     Pulse Rate 06/28/21 1749 91     Resp 06/28/21 1749 17     Temp 06/28/21 1749 99.8 F (37.7 C)     Temp Source 06/28/21 1749 Oral     SpO2 06/28/21 1749 96 %     Weight --      Height --      Head Circumference --       Peak Flow --      Pain Score 06/28/21 1748 0     Pain Loc --      Pain Edu? --      Excl. in GC? --    No data found.  Updated Vital Signs BP 122/82 (BP Location: Right Arm)   Pulse 91   Temp 99.8 F (37.7 C) (Oral)   Resp 17   LMP 06/20/2021   SpO2 96%   Visual Acuity Right Eye Distance:   Left Eye Distance:   Bilateral Distance:    Right Eye Near:   Left Eye Near:    Bilateral Near:     Physical Exam Vitals and nursing note reviewed.  Constitutional:      General: She is not in acute distress.    Appearance: She is well-developed.  HENT:     Head: Normocephalic and atraumatic.     Mouth/Throat:     Pharynx: Posterior oropharyngeal erythema present.  Eyes:     Conjunctiva/sclera: Conjunctivae normal.  Cardiovascular:     Rate and Rhythm: Normal rate and regular rhythm.     Heart sounds: No murmur heard. Pulmonary:     Effort: Pulmonary effort is normal. No respiratory distress.     Breath sounds: Normal breath sounds.  Abdominal:     Palpations: Abdomen is soft.     Tenderness: There is no abdominal tenderness.  Musculoskeletal:     Cervical back: Neck supple.  Skin:    General: Skin is warm and dry.  Neurological:     Mental Status: She is alert.     UC Treatments / Results  Labs (all labs ordered are listed, but only abnormal results are displayed) Labs Reviewed  SARS CORONAVIRUS 2 (TAT 6-24 HRS)  POCT RAPID STREP A, ED / UC    EKG   Radiology No results found.  Procedures Procedures (including critical care time)  Medications Ordered in UC Medications - No data to display  Initial Impression / Assessment and Plan / UC Course  I have reviewed the triage vital signs and the nursing notes.  Pertinent labs & imaging results that were  available during my care of the patient were reviewed by me and considered in my medical decision making (see chart for details).     Pharyngitis, strep negative. COVID pending.  Advised salt water  gargles, ibuprofen as needed.  Return if no improvement. Stable for discharge.  Final Clinical Impressions(s) / UC Diagnoses   Final diagnoses:  None   Discharge Instructions   None    ED Prescriptions   None    PDMP not reviewed this encounter.   Ward, Tylene Fantasia, PA-C 06/28/21 [phone]

## 2021-06-28 NOTE — ED Triage Notes (Signed)
Pt presents with sore throat, loss of voice, and cough xs 2-3 days. States also having skin irritation under both eyes.

## 2021-06-29 LAB — SARS CORONAVIRUS 2 (TAT 6-24 HRS): SARS Coronavirus 2: NEGATIVE

## 2021-07-23 ENCOUNTER — Encounter (HOSPITAL_BASED_OUTPATIENT_CLINIC_OR_DEPARTMENT_OTHER): Payer: Self-pay

## 2021-07-23 ENCOUNTER — Other Ambulatory Visit: Payer: Self-pay

## 2021-07-23 ENCOUNTER — Other Ambulatory Visit (HOSPITAL_BASED_OUTPATIENT_CLINIC_OR_DEPARTMENT_OTHER): Payer: Self-pay

## 2021-07-23 ENCOUNTER — Emergency Department (HOSPITAL_BASED_OUTPATIENT_CLINIC_OR_DEPARTMENT_OTHER)
Admission: EM | Admit: 2021-07-23 | Discharge: 2021-07-23 | Disposition: A | Discharge status: Home / Self Care | Payer: Self-pay | Attending: Emergency Medicine | Admitting: Emergency Medicine

## 2021-07-23 DIAGNOSIS — F17210 Nicotine dependence, cigarettes, uncomplicated: Secondary | ICD-10-CM | POA: Insufficient documentation

## 2021-07-23 DIAGNOSIS — K029 Dental caries, unspecified: Secondary | ICD-10-CM | POA: Insufficient documentation

## 2021-07-23 DIAGNOSIS — J45909 Unspecified asthma, uncomplicated: Secondary | ICD-10-CM | POA: Insufficient documentation

## 2021-07-23 DIAGNOSIS — K047 Periapical abscess without sinus: Secondary | ICD-10-CM | POA: Insufficient documentation

## 2021-07-23 DIAGNOSIS — Z7951 Long term (current) use of inhaled steroids: Secondary | ICD-10-CM | POA: Insufficient documentation

## 2021-07-23 MED ORDER — HYDROCODONE-ACETAMINOPHEN 5-325 MG PO TABS
1.0000 | ORAL_TABLET | Freq: Four times a day (QID) | ORAL | 0 refills | Status: DC | PRN
  Filled 2021-07-23: qty 12, 2d supply, fill #0

## 2021-07-23 MED ORDER — KETOROLAC TROMETHAMINE 15 MG/ML IJ SOLN: 15.0000 mg | Freq: Once | INTRAMUSCULAR | Status: DC

## 2021-07-23 MED ORDER — AMOXICILLIN-POT CLAVULANATE 875-125 MG PO TABS
1.0000 | ORAL_TABLET | Freq: Two times a day (BID) | ORAL | 0 refills | Status: DC
Start: 2021-07-23 — End: 2022-09-10
  Filled 2021-07-23: qty 14, 7d supply, fill #0

## 2021-07-23 MED ORDER — KETOROLAC TROMETHAMINE 15 MG/ML IJ SOLN
15.0000 mg | Freq: Once | INTRAMUSCULAR | Status: AC
  Administered 2021-07-23: 15 mg via INTRAMUSCULAR
  Filled 2021-07-23: qty 1

## 2021-07-23 NOTE — ED Provider Notes (Signed)
[facility] EMERGENCY DEPARTMENT Provider Note   CSN: [id number] Arrival date & time: 07/23/21  1030     History Chief Complaint  Patient presents with   Dental Pain    Regina Miller is a 25 y.o. female with no significant past medical history reports with 5 days of bottom right premolar pain.  Patient reports that she has been taking amoxicillin leftover from a previous infection.  Patient has taken 4 days of amoxicillin without relief.  Patient has not seen a dentist, has a dentist appointment scheduled in around 2 weeks.  Patient reports a nasty taste in her mouth, significant pain of the jaw and tooth that is interfering with her sleep.  Patient does report subjective fever and chills a few days ago.  Patient is not having an issue swallowing, or talking at this time.  Pain is 10 out of 10, and sharp in nature.   Dental Pain     Past Medical History:  Diagnosis Date   Acid reflux    Anxiety    Asthma    Attention deficit disorder    Heart murmur    Reports she had a heart murmur as a baby that has been evaluated. She is not currently followed by a cardiologist.    Patient Active Problem List   Diagnosis Date Noted   Morbid obesity (HCC) 07/19/2019   First trimester pregnancy 07/26/2017   Anxiety 12/22/2013   Eczema 12/22/2013   Body mass index, pediatric, greater than or 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 age 06/24/2014   Acne 04/26/2012   Insomnia 07/06/2011   GERD 09/19/2008   ATTENTION DEFICIT, W/HYPERACTIVITY 12/17/2006    Past Surgical History:  Procedure Laterality Date   TONSILLECTOMY     TYMPANOSTOMY TUBE PLACEMENT Bilateral      OB History     Gravida  1   Para      Term      Preterm      AB      Living         SAB      IAB      Ectopic      Multiple      Live Births              Family History  Problem Relation Age of Onset   Psoriasis Mother    Hypertension Father    Eczema Father    Diabetes Father    Heart  disease Paternal Grandmother    Cancer Maternal Grandmother     Social History   Tobacco Use   Smoking status: Every Day    Packs/day: 1.00    Types: Cigarettes   Smokeless tobacco: Never   Tobacco comments:    Parents smoke around her  Vaping Use   Vaping Use: Never used  Substance Use Topics   Alcohol use: Not Currently    Comment: occ   Drug use: Not Currently    Types: Marijuana    Home Medications Prior to Admission medications   Medication Sig Start Date End Date Taking? Authorizing Provider  amoxicillin-clavulanate (AUGMENTIN) 875-125 MG tablet Take 1 tablet by mouth every 12 (twelve) hours. 07/23/21  Yes Fredy Gladu H, PA-C  HYDROcodone-acetaminophen (NORCO/VICODIN) 5-325 MG tablet Take 1-2 tablets by mouth every 6 (six) hours as needed. 07/23/21  Yes Tahir Blank H, PA-C  albuterol (VENTOLIN HFA) 108 (90 Base) MCG/ACT inhaler Inhale 1-2 puffs into the lungs every 6 (six) hours as needed for wheezing  or shortness of breath. 06/28/21   Ward, Tylene Fantasia, PA-C  ibuprofen (ADVIL) 800 MG tablet Take 1 tablet (800 mg total) by mouth every 8 (eight) hours as needed. 01/02/21   Mickie Bail, NP  naproxen (NAPROSYN) 500 MG tablet Take 1 tablet (500 mg total) by mouth 2 (two) times daily. 06/08/20   Wieters, Hallie C, PA-C  sodium chloride (OCEAN) 0.65 % SOLN nasal spray Place 2 sprays into both nostrils every 2 (two) hours while awake. 07/19/19 08/18/19  Betancourt, Jarold Song, NP  diphenhydrAMINE (BENADRYL) 25 mg capsule Take 1 capsule (25 mg total) by mouth every 8 (eight) hours as needed for allergies. 07/19/19 10/18/19  Betancourt, Jarold Song, NP  loratadine (CLARITIN) 10 MG tablet Take 1 tablet (10 mg total) by mouth daily. 07/19/19 10/18/19  Betancourt, Jarold Song, NP    Allergies    Kiwi extract and Other  Review of Systems   Review of Systems  HENT:         Tooth pain  All other systems reviewed and are negative.  Physical Exam Updated Vital Signs BP 122/76 (BP  Location: Left Arm)   Pulse 61   Temp 98.2 F (36.8 C) (Oral)   Resp 18   Ht 5\' 2"  (1.575 m)   Wt 97.5 kg   SpO2 100%   BMI 39.32 kg/m   Physical Exam Vitals and nursing note reviewed.  Constitutional:      General: She is not in acute distress.    Appearance: Normal appearance.  HENT:     Head: Normocephalic and atraumatic.     Comments: Minimal right buccal mucosal swelling in the distribution of the infected tooth.    Mouth/Throat:     Comments: Oropharynx is moist, no lesions present.  Uvula is midline.  Tongue without abnormality.  There is a large dental carry with missing crown of one of the right premolars.  There is some swelling and redness associated in the gumline. Eyes:     General:        Right eye: No discharge.        Left eye: No discharge.  Neck:     Comments: No swelling or tenderness of the neck.  Patient is able to swallow without difficulty. Cardiovascular:     Rate and Rhythm: Normal rate and regular rhythm.  Pulmonary:     Effort: Pulmonary effort is normal. No respiratory distress.  Musculoskeletal:        General: No deformity.  Skin:    General: Skin is warm and dry.  Neurological:     Mental Status: She is alert and oriented to person, place, and time.  Psychiatric:        Mood and Affect: Mood normal.        Behavior: Behavior normal.    ED Results / Procedures / Treatments   Labs (all labs ordered are listed, but only abnormal results are displayed) Labs Reviewed - No data to display  EKG None  Radiology No results found.  Procedures Procedures   Medications Ordered in ED Medications  ketorolac (TORADOL) 15 MG/ML injection 15 mg (has no administration in time range)    ED Course  I have reviewed the triage vital signs and the nursing notes.  Pertinent labs & imaging results that were available during my care of the patient were reviewed by me and considered in my medical decision making (see chart for details).    MDM  Rules/Calculators/A&P  Patient with signs and symptoms of a simple dental carry infection of the first right premolar.  Patient has approximately half of the crown of the tooth missing, and with dark Khary noted.  Patient has some local gingival inflammation.  Patient has some minor swelling of the mucosa in the surrounding area.  Patient has no signs of Ludwick's angina, no unilateral deviation of the tongue or uvula.  No evidence of dental abscess.  No purulent drainage noted.  Patient is taking at home antibiotics without direction, and has noticed some minimal improvement.  We will escalate antibiotics to Augmentin twice daily for 7 days, encouraged continuing to use ibuprofen, Tylenol, will provide a few days of Norco for pain control.  Encouraged urgent follow-up with dentistry as this will be a definitive treatment for patient condition.  Return precautions given including worsening infection, swelling of the throat, difficulty swallowing, fever, chills.  Discharged in stable condition. Final Clinical Impression(s) / ED Diagnoses Final diagnoses:  Dental infection  Dental caries    Rx / DC Orders ED Discharge Orders          Ordered    amoxicillin-clavulanate (AUGMENTIN) 875-125 MG tablet  Every 12 hours        07/23/21 1534    HYDROcodone-acetaminophen (NORCO/VICODIN) 5-325 MG tablet  Every 6 hours PRN        07/23/21 1534             Retha Bither, Harrel Carina, PA-C 07/23/21 1537    Benjiman Core, MD 07/23/21 1546

## 2021-07-23 NOTE — Discharge Instructions (Addendum)
Please use Tylenol or ibuprofen for pain.  You may use 800 mg ibuprofen every 6 hours or 1000 mg of Tylenol every 6 hours.  You may choose to alternate between the 2.  This would be most effective.  Not to exceed 4 g of Tylenol within 24 hours.  Not to exceed 3200 mg ibuprofen 24 hours.  You can use the Norco *in place of* the Tylenol.  Follow-up with your dentist at your earliest convenience definitive treatment for your cavity is surgery, dental intervention.  I have attached the information of a dentist in our network that you can contact to try to schedule an appointment sooner than 2-3 weeks.  Please return if your infection worsens.  Especially if you have fever, chills, purulent drainage.

## 2021-07-23 NOTE — ED Notes (Signed)
Pt reports she was given "something that caused my blood pressure to drop into the 80s" while at a [facility] earlier this year.  Pt is unsure what she was given.  Will hold Pt for to insure no reaction.

## 2021-07-23 NOTE — ED Triage Notes (Signed)
Pt c/o bottom right tooth pain x 5 days. Been taking leftover amoxicillin from previous infection. Hasnt seen a dentist. Denies fevers

## 2021-09-29 ENCOUNTER — Ambulatory Visit (HOSPITAL_COMMUNITY)
Admission: EM | Admit: 2021-09-29 | Discharge: 2021-09-29 | Disposition: A | Discharge status: Home / Self Care | Payer: Self-pay | Attending: Emergency Medicine | Admitting: Emergency Medicine

## 2021-09-29 ENCOUNTER — Encounter (HOSPITAL_COMMUNITY): Payer: Self-pay | Admitting: Emergency Medicine

## 2021-09-29 ENCOUNTER — Telehealth: Payer: Self-pay | Admitting: Emergency Medicine

## 2021-09-29 DIAGNOSIS — Z3202 Encounter for pregnancy test, result negative: Secondary | ICD-10-CM

## 2021-09-29 DIAGNOSIS — Z975 Presence of (intrauterine) contraceptive device: Secondary | ICD-10-CM

## 2021-09-29 DIAGNOSIS — N939 Abnormal uterine and vaginal bleeding, unspecified: Secondary | ICD-10-CM

## 2021-09-29 DIAGNOSIS — N946 Dysmenorrhea, unspecified: Secondary | ICD-10-CM

## 2021-09-29 DIAGNOSIS — N921 Excessive and frequent menstruation with irregular cycle: Secondary | ICD-10-CM

## 2021-09-29 LAB — POC URINE PREG, ED: Preg Test, Ur: NEGATIVE

## 2021-09-29 MED ORDER — MEGESTROL ACETATE 40 MG PO TABS
40.0000 mg | ORAL_TABLET | Freq: Every day | ORAL | 0 refills | Status: DC
Start: 2021-09-29 — End: 2022-09-13

## 2021-09-29 NOTE — Progress Notes (Signed)
Virtual Visit Consent   Regina Miller, you are scheduled for a virtual visit with a [facility] provider today.     Just as with appointments in the office, your consent must be obtained to participate.  Your consent will be active for this visit and any virtual visit you may have with one of our providers in the next 365 days.     If you have a MyChart account, a copy of this consent can be sent to you electronically.  All virtual visits are billed to your insurance company just like a traditional visit in the office.    As this is a virtual visit, video technology does not allow for your provider to perform a traditional examination.  This may limit your provider's ability to fully assess your condition.  If your provider identifies any concerns that need to be evaluated in person or the need to arrange testing (such as labs, EKG, etc.), we will make arrangements to do so.     Although advances in technology are sophisticated, we cannot ensure that it will always work on either your end or our end.  If the connection with a video visit is poor, the visit may have to be switched to a telephone visit.  With either a video or telephone visit, we are not always able to ensure that we have a secure connection.     I need to obtain your verbal consent now.   Are you willing to proceed with your visit today?  Yes   Regina Miller has provided verbal consent on 09/29/2021 for a virtual visit (video or telephone).   Rennis Harding, [place]   Date: 09/29/2021 12:24 PM   Virtual Visit via Video Note   I, Rennis Harding, connected with  Regina Miller  ([id number], 05-14-1996) on 09/29/21 at 12:15 PM EST by a video-enabled telemedicine application and verified that I am speaking with the correct person using two identifiers.  Location: Patient: Virtual Visit Location Patient: Home Provider: Virtual Visit Location Provider: Home Office   I discussed the limitations of evaluation and  management by telemedicine and the availability of in person appointments. The patient expressed understanding and agreed to proceed.    History of Present Illness: Regina Miller is a 25 y.o. who identifies as a female who was assigned female at birth, and is being seen today for vaginal bleeding and improving cramping x 4-5 days.  Nexplanon placed approximately 2 weeks ago.  Last sex 6 days ago, did not use protection at that time.  Bleeding through super tampons 3-4 hours.  Has tried tylenol with relief.  Denies previous symptoms in the past.  Denies fever, fatigue, vomiting, fatigue, SOB, CP.    Patient is suspects she has PCOS and is unable to get pregnant.  Denies concern for pregnancy at this time.    HPI: HPI  Problems:  Patient Active Problem List   Diagnosis Date Noted   Morbid obesity (HCC) 07/19/2019   First trimester pregnancy 07/26/2017   Anxiety 12/22/2013   Eczema 12/22/2013   Body mass index, pediatric, greater than or 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 age 19/02/2014   Acne 04/26/2012   Insomnia 07/06/2011   GERD 09/19/2008   ATTENTION DEFICIT, W/HYPERACTIVITY 12/17/2006    Allergies:  Allergies  Allergen Reactions   Kiwi Extract Nausea And Vomiting   Other Hives    Nair hair removal   Medications:  Current Outpatient Medications:    albuterol (VENTOLIN HFA) 108 (  90 Base) MCG/ACT inhaler, Inhale 1-2 puffs into the lungs every 6 (six) hours as needed for wheezing or shortness of breath., Disp: 1 each, Rfl: 1   amoxicillin-clavulanate (AUGMENTIN) 875-125 MG tablet, Take 1 tablet by mouth every 12 (twelve) hours., Disp: 14 tablet, Rfl: 0   HYDROcodone-acetaminophen (NORCO/VICODIN) 5-325 MG tablet, Take 1-2 tablets by mouth every 6 (six) hours as needed., Disp: 12 tablet, Rfl: 0   ibuprofen (ADVIL) 800 MG tablet, Take 1 tablet (800 mg total) by mouth every 8 (eight) hours as needed., Disp: 21 tablet, Rfl: 0   naproxen (NAPROSYN) 500 MG tablet, Take 1 tablet (500 mg total)  by mouth 2 (two) times daily., Disp: 30 tablet, Rfl: 0   sodium chloride (OCEAN) 0.65 % SOLN nasal spray, Place 2 sprays into both nostrils every 2 (two) hours while awake., Disp: , Rfl: 0  Observations/Objective: Patient is well-developed, well-nourished in no acute distress.  Resting comfortably at home. Up walking around home without difficulty - speaking in full sentences Head is normocephalic, atraumatic.  No labored breathing.  Speech is clear and coherent with logical content.  Patient is alert and oriented at baseline.    Assessment and Plan: 1. Dysmenorrhea  2. Breakthrough bleeding on Nexplanon Rest and push fluids Continue with OTC medications like tylenol and ibuprofen for menstrual cramping Keep a log of how many tampons/ pads you are bleeding through - if it consistently increases and/or you experience abdominal pain, fever, vomiting, vaginal discharge, vaginal odor, vaginal pain, feeling faint, chest pain, shortness of breath, etc... please call 911 or go in person to urgent care or the ER to be evaluated and treated  Follow Up Instructions: I discussed the assessment and treatment plan with the patient. The patient was provided an opportunity to ask questions and all were answered. The patient agreed with the plan and demonstrated an understanding of the instructions.  A copy of instructions were sent to the patient via MyChart unless otherwise noted below.    The patient was advised to call back or seek an in-person evaluation if the symptoms worsen or if the condition fails to improve as anticipated.  Time:  I spent 5-10 minutes with the patient via telehealth technology discussing the above problems/concerns.    Rennis Harding, PA-C

## 2021-09-29 NOTE — ED Triage Notes (Signed)
Pt presents with vaginal bleeding xs 4 days. States had bc implant places 2 weeks ago.

## 2021-09-29 NOTE — ED Provider Notes (Signed)
[facility]   [id number] 09/29/21 Arrival Time: 1730  Chief Complaint  Patient presents with   Vaginal Bleeding     SUBJECTIVE: History from: patient.  Regina Miller is a 25 y.o. female .who presents with complaint of vaginal bleeding for the past 2 weeks.  Denies trauma (rough intercourse), and reports she started Nexplanon a few days ago.  Is NOT trying to conceive.  This is her first period since and she is experiencing heavy bleeding.Marland Kitchen  Has soaked through multiple pads today, which is  abnormal for her usual menstrual cycle.  Denies aggravating factors. Has NOT taken a home pregnancy test.   Denies similar symptoms in the past. She denies fever, chills, fatigue, breast tenderness, lightheadedness, dizziness, tachycardia, SOB, abdominal or pelvic pain, dysuria, increased urinary frequency or urgency, abnormal vaginal discharge, dyspareunia.   .     Past Medical History:  Diagnosis Date   Acid reflux    Anxiety    Asthma    Attention deficit disorder    Heart murmur    Reports she had a heart murmur as a baby that has been evaluated. She is not currently followed by a cardiologist.   Past Surgical History:  Procedure Laterality Date   TONSILLECTOMY     TYMPANOSTOMY TUBE PLACEMENT Bilateral    Allergies  Allergen Reactions   Kiwi Extract Nausea And Vomiting   Other Hives    Nair hair removal   No current facility-administered medications on file prior to encounter.   Current Outpatient Medications on File Prior to Encounter  Medication Sig Dispense Refill   albuterol (VENTOLIN HFA) 108 (90 Base) MCG/ACT inhaler Inhale 1-2 puffs into the lungs every 6 (six) hours as needed for wheezing or shortness of breath. 1 each 1   amoxicillin-clavulanate (AUGMENTIN) 875-125 MG tablet Take 1 tablet by mouth every 12 (twelve) hours. 14 tablet 0   HYDROcodone-acetaminophen (NORCO/VICODIN) 5-325 MG tablet Take 1-2 tablets by mouth every 6 (six) hours as needed. 12 tablet 0    ibuprofen (ADVIL) 800 MG tablet Take 1 tablet (800 mg total) by mouth every 8 (eight) hours as needed. 21 tablet 0   naproxen (NAPROSYN) 500 MG tablet Take 1 tablet (500 mg total) by mouth 2 (two) times daily. 30 tablet 0   sodium chloride (OCEAN) 0.65 % SOLN nasal spray Place 2 sprays into both nostrils every 2 (two) hours while awake.  0   [DISCONTINUED] diphenhydrAMINE (BENADRYL) 25 mg capsule Take 1 capsule (25 mg total) by mouth every 8 (eight) hours as needed for allergies. 30 capsule 0   [DISCONTINUED] loratadine (CLARITIN) 10 MG tablet Take 1 tablet (10 mg total) by mouth daily. 30 tablet 0   Social History   Socioeconomic History   Marital status: Single    Spouse name: Not on file   Number of children: Not on file   Years of education: Not on file   Highest education level: Not on file  Occupational History   Not on file  Tobacco Use   Smoking status: Every Day    Packs/day: 1.00    Types: Cigarettes   Smokeless tobacco: Never   Tobacco comments:    Parents smoke around her  Vaping Use   Vaping Use: Never used  Substance and Sexual Activity   Alcohol use: Not Currently    Comment: occ   Drug use: Not Currently    Types: Marijuana   Sexual activity: Yes    Birth control/protection: None  Other Topics  Concern   Not on file  Social History Narrative   Lives with father and step-mom. Mother lives in [place]. Has multiple siblings through dad but none who live at home with her as they are all adults.   Social Determinants of Health   Financial Resource Strain: Not on file  Food Insecurity: Not on file  Transportation Needs: Not on file  Physical Activity: Not on file  Stress: Not on file  Social Connections: Not on file  Intimate Partner Violence: Not on file   Family History  Problem Relation Age of Onset   Psoriasis Mother    Hypertension Father    Eczema Father    Diabetes Father    Heart disease Paternal Grandmother    Cancer Maternal Grandmother      OBJECTIVE:  Vitals:   09/29/21 1808  BP: (!) 122/59  Pulse: 89  Resp: 17  Temp: 98.4 F (36.9 C)  TempSrc: Oral  SpO2: 100%     Physical Exam Vitals and nursing note reviewed.  Constitutional:      General: She is not in acute distress.    Appearance: Normal appearance. She is normal weight. She is not ill-appearing, toxic-appearing or diaphoretic.  HENT:     Head: Normocephalic.  Cardiovascular:     Rate and Rhythm: Normal rate and regular rhythm.     Pulses: Normal pulses.     Heart sounds: Normal heart sounds. No murmur heard.   No friction rub. No gallop.  Pulmonary:     Effort: Pulmonary effort is normal. No respiratory distress.     Breath sounds: Normal breath sounds. No stridor. No wheezing, rhonchi or rales.  Chest:     Chest wall: No tenderness.  Neurological:     Mental Status: She is alert and oriented to person, place, and time.     LABS:  No results found for this or any previous visit (from the past 24 hour(s)).   ASSESSMENT & PLAN:  1. Vaginal bleeding   2. Negative pregnancy test     Meds ordered this encounter  Medications   megestrol (MEGACE) 40 MG tablet    Sig: Take 1 tablet (40 mg total) by mouth daily.    Dispense:  30 tablet    Refill:  0    Patient is hemodynamically stable therefore will prescribe Megace.  She was advised to follow-up with OB/GYN   Discharge instructions  Pregnancy test was negative Megace was prescribed/take as directed Follow-up with OB/GYN Return or go to ED if you develop any new or worsening of your symptoms  Reviewed expectations re: course of current medical issues. Questions answered. Outlined signs and symptoms indicating need for more acute intervention. Patient verbalized understanding. After Visit Summary given.          Durward Parcel, FNP 09/29/21 1906

## 2021-09-29 NOTE — Patient Instructions (Signed)
  Regina Miller, thank you for joining [place], PA-C for today's virtual visit.  While this provider is not your primary care provider (PCP), if your PCP is located in our provider database this encounter information will be shared with them immediately following your visit.  Consent: (Patient) Regina Miller provided verbal consent for this virtual visit at the beginning of the encounter.  Current Medications:  Current Outpatient Medications:    albuterol (VENTOLIN HFA) 108 (90 Base) MCG/ACT inhaler, Inhale 1-2 puffs into the lungs every 6 (six) hours as needed for wheezing or shortness of breath., Disp: 1 each, Rfl: 1   amoxicillin-clavulanate (AUGMENTIN) 875-125 MG tablet, Take 1 tablet by mouth every 12 (twelve) hours., Disp: 14 tablet, Rfl: 0   HYDROcodone-acetaminophen (NORCO/VICODIN) 5-325 MG tablet, Take 1-2 tablets by mouth every 6 (six) hours as needed., Disp: 12 tablet, Rfl: 0   ibuprofen (ADVIL) 800 MG tablet, Take 1 tablet (800 mg total) by mouth every 8 (eight) hours as needed., Disp: 21 tablet, Rfl: 0   naproxen (NAPROSYN) 500 MG tablet, Take 1 tablet (500 mg total) by mouth 2 (two) times daily., Disp: 30 tablet, Rfl: 0   sodium chloride (OCEAN) 0.65 % SOLN nasal spray, Place 2 sprays into both nostrils every 2 (two) hours while awake., Disp: , Rfl: 0   Medications ordered in this encounter:  No orders of the defined types were placed in this encounter.    *If you need refills on other medications prior to your next appointment, please contact your pharmacy*  Follow-Up: Call back or seek an in-person evaluation if the symptoms worsen or if the condition fails to improve as anticipated.  Other Instructions Rest and push fluids Continue with OTC medications like tylenol and ibuprofen for menstrual cramping Keep a log of how many tampons/ pads you are bleeding through - if it consistently increases and/or you experience abdominal pain, fever, vomiting, vaginal  discharge, vaginal odor, vaginal pain, feeling faint, chest pain, shortness of breath, etc... please call 911 or go in person to urgent care or the ER to be evaluated and treated   If you have been instructed to have an in-person evaluation today at a local Urgent Care facility, please use the link below. It will take you to a list of all of our available [facility], including address, phone number and hours of operation. Please do not delay care.  [facility]  If you or a family member do not have a primary care provider, use the link below to schedule a visit and establish care. When you choose a [facility] primary care physician or advanced practice provider, you gain a long-term partner in health. Find a Primary Care Provider  Learn more about [facility]'s in-office and virtual care options: [facility] - Get Care Now

## 2021-09-29 NOTE — Discharge Instructions (Addendum)
Pregnancy test was negative Megace was prescribed/take as directed Follow-up with OB/GYN Return or go to ED if you develop any new or worsening of your symptoms

## 2022-09-10 ENCOUNTER — Encounter (HOSPITAL_COMMUNITY): Payer: Self-pay | Admitting: Emergency Medicine

## 2022-09-10 ENCOUNTER — Ambulatory Visit (INDEPENDENT_AMBULATORY_CARE_PROVIDER_SITE_OTHER): Payer: 59

## 2022-09-10 ENCOUNTER — Ambulatory Visit (HOSPITAL_COMMUNITY)
Admission: EM | Admit: 2022-09-10 | Discharge: 2022-09-10 | Disposition: A | Discharge status: Home / Self Care | Payer: 59 | Attending: Nurse Practitioner | Admitting: Nurse Practitioner

## 2022-09-10 DIAGNOSIS — R059 Cough, unspecified: Secondary | ICD-10-CM

## 2022-09-10 DIAGNOSIS — R062 Wheezing: Secondary | ICD-10-CM

## 2022-09-10 DIAGNOSIS — J45901 Unspecified asthma with (acute) exacerbation: Secondary | ICD-10-CM

## 2022-09-10 DIAGNOSIS — R051 Acute cough: Secondary | ICD-10-CM

## 2022-09-10 DIAGNOSIS — R0602 Shortness of breath: Secondary | ICD-10-CM

## 2022-09-10 MED ORDER — IPRATROPIUM-ALBUTEROL 0.5-2.5 (3) MG/3ML IN SOLN: 3.0000 mL | Freq: Four times a day (QID) | RESPIRATORY_TRACT | 0 refills | Status: DC | PRN

## 2022-09-10 MED ORDER — BENZONATATE 100 MG PO CAPS: 100.0000 mg | ORAL_CAPSULE | Freq: Three times a day (TID) | ORAL | 0 refills | Status: DC

## 2022-09-10 MED ORDER — AMOXICILLIN-POT CLAVULANATE 875-125 MG PO TABS: 1.0000 | ORAL_TABLET | Freq: Two times a day (BID) | ORAL | 0 refills | Status: DC

## 2022-09-10 MED ORDER — IPRATROPIUM-ALBUTEROL 0.5-2.5 (3) MG/3ML IN SOLN
RESPIRATORY_TRACT | Status: AC
  Filled 2022-09-10: qty 3

## 2022-09-10 MED ORDER — IPRATROPIUM-ALBUTEROL 0.5-2.5 (3) MG/3ML IN SOLN
3.0000 mL | Freq: Once | RESPIRATORY_TRACT | Status: AC
  Administered 2022-09-10: 3 mL via RESPIRATORY_TRACT

## 2022-09-10 MED ORDER — PREDNISONE 10 MG PO TABS: 20.0000 mg | ORAL_TABLET | Freq: Every day | ORAL | 0 refills | Status: DC

## 2022-09-10 NOTE — ED Triage Notes (Signed)
Pt reports a cough, fever, shortness of breath and feeling she's breathing underwater x 3 days. States has been sick the past 2 weeks. States she was seen at [facility] yesterday and got a covid test. Results were neg but advised to come to [facility] for possible pneumonia.

## 2022-09-10 NOTE — ED Provider Notes (Signed)
[facility]    CSN: [id number] Arrival date & time: 09/10/22  1009      History   Chief Complaint Chief Complaint  Patient presents with   Fever   Cough    HPI Regina Miller is a 26 y.o. female.   HPI  Regina Miller is in today for a cough and congestion. She reports that these symptoms came on 3 days ago. She has a history of asthma that is not controlled. She is prescribed Albuterol as needed. She has had fever, chills, nasal congestion, sneezing, runny nose,  shortness of breath, wheezing, chest pain, nausea. She denies new sore throat, new loss of smell or taste, diarrhea. Exposure negative COVID/Influenza/Strep. She has a COVID test on yesterday which was negative.  Past Medical History:  Diagnosis Date   Acid reflux    Anxiety    Asthma    Attention deficit disorder    Heart murmur    Reports she had a heart murmur as a baby that has been evaluated. She is not currently followed by a cardiologist.    Patient Active Problem List   Diagnosis Date Noted   Morbid obesity (HCC) 07/19/2019   First trimester pregnancy 07/26/2017   Anxiety 12/22/2013   Eczema 12/22/2013   Body mass index, pediatric, greater than or 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 age 56/02/2014   Acne 04/26/2012   Insomnia 07/06/2011   GERD 09/19/2008   ATTENTION DEFICIT, W/HYPERACTIVITY 12/17/2006    Past Surgical History:  Procedure Laterality Date   TONSILLECTOMY     TYMPANOSTOMY TUBE PLACEMENT Bilateral     OB History     Gravida  1   Para      Term      Preterm      AB      Living         SAB      IAB      Ectopic      Multiple      Live Births               Home Medications    Prior to Admission medications   Medication Sig Start Date End Date Taking? Authorizing Provider  amoxicillin-clavulanate (AUGMENTIN) 875-125 MG tablet Take 1 tablet by mouth every 12 (twelve) hours. 09/10/22  Yes Ceira Hoeschen, Shana Chute, NP  benzonatate (TESSALON) 100 MG capsule  Take 1 capsule (100 mg total) by mouth every 8 (eight) hours. 09/10/22  Yes Bostyn Kunkler, Shana Chute, NP  ipratropium-albuterol (DUONEB) 0.5-2.5 (3) MG/3ML SOLN Take 3 mLs by nebulization every 6 (six) hours as needed. 09/10/22 10/10/22 Yes Myley Bahner, Shana Chute, NP  predniSONE (DELTASONE) 10 MG tablet Take 2 tablets (20 mg total) by mouth daily. 09/10/22  Yes Barbette Merino, NP  albuterol (VENTOLIN HFA) 108 (90 Base) MCG/ACT inhaler Inhale 1-2 puffs into the lungs every 6 (six) hours as needed for wheezing or shortness of breath. 06/28/21   Ward, Tylene Fantasia, PA-C  HYDROcodone-acetaminophen (NORCO/VICODIN) 5-325 MG tablet Take 1-2 tablets by mouth every 6 (six) hours as needed. 07/23/21   Prosperi, Christian H, PA-C  ibuprofen (ADVIL) 800 MG tablet Take 1 tablet (800 mg total) by mouth every 8 (eight) hours as needed. 01/02/21   Mickie Bail, NP  megestrol (MEGACE) 40 MG tablet Take 1 tablet (40 mg total) by mouth daily. 09/29/21   Avegno, Zachery Dakins, FNP  naproxen (NAPROSYN) 500 MG tablet Take 1 tablet (500 mg total) by mouth 2 (two) times  daily. 06/08/20   Wieters, Hallie C, PA-C  sodium chloride (OCEAN) 0.65 % SOLN nasal spray Place 2 sprays into both nostrils every 2 (two) hours while awake. 07/19/19 08/18/19  Betancourt, Jarold Song, NP  diphenhydrAMINE (BENADRYL) 25 mg capsule Take 1 capsule (25 mg total) by mouth every 8 (eight) hours as needed for allergies. 07/19/19 10/18/19  Betancourt, Jarold Song, NP  loratadine (CLARITIN) 10 MG tablet Take 1 tablet (10 mg total) by mouth daily. 07/19/19 10/18/19  Betancourt, Jarold Song, NP    Family History Family History  Problem Relation Age of Onset   Psoriasis Mother    Hypertension Father    Eczema Father    Diabetes Father    Heart disease Paternal Grandmother    Cancer Maternal Grandmother     Social History Social History   Tobacco Use   Smoking status: Every Day    Packs/day: 1.00    Types: Cigarettes   Smokeless tobacco: Never   Tobacco comments:    Parents smoke  around her  Vaping Use   Vaping Use: Never used  Substance Use Topics   Alcohol use: Not Currently    Comment: occ   Drug use: Not Currently    Types: Marijuana     Allergies   Kiwi extract and Other   Review of Systems Review of Systems   Physical Exam Triage Vital Signs ED Triage Vitals  Enc Vitals Group     BP      Pulse      Resp      Temp      Temp src      SpO2      Weight      Height      Head Circumference      Peak Flow      Pain Score      Pain Loc      Pain Edu?      Excl. in GC?    No data found.  Updated Vital Signs BP 117/83 (BP Location: Right Arm)   Pulse 88   Temp 99.2 F (37.3 C) (Oral)   Resp 17   SpO2 97%   Visual Acuity Right Eye Distance:   Left Eye Distance:   Bilateral Distance:    Right Eye Near:   Left Eye Near:    Bilateral Near:     Physical Exam Constitutional:      General: She is in acute distress (related to URI symptoms).     Appearance: She is obese.  HENT:     Head: Normocephalic and atraumatic.     Right Ear: Tympanic membrane normal.     Left Ear: Tympanic membrane normal.     Nose: Nose normal.     Mouth/Throat:     Mouth: Mucous membranes are moist.  Eyes:     Pupils: Pupils are equal, round, and reactive to light.  Cardiovascular:     Rate and Rhythm: Normal rate and regular rhythm.     Pulses: Normal pulses.  Pulmonary:     Breath sounds: Examination of the right-middle field reveals wheezing. Examination of the left-middle field reveals wheezing. Examination of the right-lower field reveals wheezing. Examination of the left-lower field reveals wheezing. Wheezing present.  Musculoskeletal:        General: Normal range of motion.     Cervical back: Normal range of motion.  Skin:    General: Skin is warm and dry.     Capillary Refill: Capillary refill  takes less than 2 seconds.  Neurological:     General: No focal deficit present.     Mental Status: She is alert and oriented to person, place, and  time.  Psychiatric:        Mood and Affect: Mood normal.      UC Treatments / Results  Labs (all labs ordered are listed, but only abnormal results are displayed) Labs Reviewed - No data to display  EKG   Radiology DG Chest 2 View  Result Date: 09/10/2022 CLINICAL DATA:  Shortness of breath. Coarse breath sounds. Cough for 3 days. EXAM: CHEST - 2 VIEW COMPARISON:  None Available. FINDINGS: Cardiac silhouette and mediastinal contours are within normal limits. The lungs are clear. No pleural effusion or pneumothorax. Mild dextrocurvature of the mid to lower thoracic spine. Mild multilevel degenerative disc changes. IMPRESSION: No active cardiopulmonary disease. Electronically Signed   By: Neita Garnet M.D.   On: 09/10/2022 10:46    Procedures Procedures (including critical care time)  Medications Ordered in UC Medications  ipratropium-albuterol (DUONEB) 0.5-2.5 (3) MG/3ML nebulizer solution 3 mL (3 mLs Nebulization Given 09/10/22 1044)    Initial Impression / Assessment and Plan / UC Course  I have reviewed the triage vital signs and the nursing notes.  Pertinent labs & imaging results that were available during my care of the patient were reviewed by me and considered in my medical decision making (see chart for details).     Wheezing and shortness of breath  Final Clinical Impressions(s) / UC Diagnoses   Final diagnoses:  Acute cough  Wheezing  Moderate asthma with acute exacerbation, unspecified whether persistent     Discharge Instructions      Your chest xray was negative. You may have an asthma exacerbation with URI. Prednisone 10 mg 2 tablets daily for 7 days. Also nebulizer treatment Duoneb for home use.  You have been prescribed Augmentin 875 mg bid x 7 days  We encourage conservative treatment with symptom relief. We encourage you to use Tylenol alternating with Ibuprofen for your fever if not contraindicated. (Remember to use as directed do not exceed  daily dosing recommendations) Your cough can be soothed with a cough suppressant. We have prescribed you a cough suppressant Benzonatate 100 mg every 8 hours for cough.      ED Prescriptions     Medication Sig Dispense Auth. Provider   ipratropium-albuterol (DUONEB) 0.5-2.5 (3) MG/3ML SOLN Take 3 mLs by nebulization every 6 (six) hours as needed. 360 mL Thad Ranger M, NP   predniSONE (DELTASONE) 10 MG tablet Take 2 tablets (20 mg total) by mouth daily. 15 tablet Thad Ranger M, NP   amoxicillin-clavulanate (AUGMENTIN) 875-125 MG tablet Take 1 tablet by mouth every 12 (twelve) hours. 14 tablet Thad Ranger M, NP   benzonatate (TESSALON) 100 MG capsule Take 1 capsule (100 mg total) by mouth every 8 (eight) hours. 21 capsule Barbette Merino, NP      PDMP not reviewed this encounter.   Thad Ranger [place], [place] 09/10/22 1123

## 2022-09-10 NOTE — Discharge Instructions (Addendum)
Your chest xray was negative. You may have an asthma exacerbation with URI. Prednisone 10 mg 2 tablets daily for 7 days. Also nebulizer treatment Duoneb for home use.  You have been prescribed Augmentin 875 mg bid x 7 days  We encourage conservative treatment with symptom relief. We encourage you to use Tylenol alternating with Ibuprofen for your fever if not contraindicated. (Remember to use as directed do not exceed daily dosing recommendations) Your cough can be soothed with a cough suppressant. We have prescribed you a cough suppressant Benzonatate 100 mg every 8 hours for cough.

## 2022-09-13 ENCOUNTER — Encounter (HOSPITAL_COMMUNITY): Payer: Self-pay

## 2022-09-13 ENCOUNTER — Ambulatory Visit (HOSPITAL_COMMUNITY)
Admission: EM | Admit: 2022-09-13 | Discharge: 2022-09-13 | Disposition: A | Discharge status: Home / Self Care | Payer: 59 | Attending: Emergency Medicine | Admitting: Emergency Medicine

## 2022-09-13 DIAGNOSIS — R058 Other specified cough: Secondary | ICD-10-CM

## 2022-09-13 DIAGNOSIS — F17210 Nicotine dependence, cigarettes, uncomplicated: Secondary | ICD-10-CM | POA: Diagnosis not present

## 2022-09-13 DIAGNOSIS — J4541 Moderate persistent asthma with (acute) exacerbation: Secondary | ICD-10-CM

## 2022-09-13 MED ORDER — FLUTICASONE PROPIONATE 50 MCG/ACT NA SUSP: 1.0000 | Freq: Every day | NASAL | 2 refills | Status: DC

## 2022-09-13 MED ORDER — IPRATROPIUM-ALBUTEROL 20-100 MCG/ACT IN AERS: 1.0000 | INHALATION_SPRAY | Freq: Four times a day (QID) | RESPIRATORY_TRACT | 2 refills | Status: DC | PRN

## 2022-09-13 MED ORDER — MONTELUKAST SODIUM 10 MG PO TABS: 10.0000 mg | ORAL_TABLET | Freq: Every day | ORAL | 2 refills | Status: DC

## 2022-09-13 MED ORDER — IPRATROPIUM-ALBUTEROL 0.5-2.5 (3) MG/3ML IN SOLN
3.0000 mL | Freq: Once | RESPIRATORY_TRACT | Status: AC
  Administered 2022-09-13: 3 mL via RESPIRATORY_TRACT

## 2022-09-13 MED ORDER — AZITHROMYCIN 250 MG PO TABS: ORAL_TABLET | ORAL | 0 refills | Status: AC

## 2022-09-13 MED ORDER — CETIRIZINE HCL 10 MG PO TABS: 10.0000 mg | ORAL_TABLET | Freq: Every day | ORAL | 2 refills | Status: DC

## 2022-09-13 MED ORDER — IPRATROPIUM-ALBUTEROL 0.5-2.5 (3) MG/3ML IN SOLN
RESPIRATORY_TRACT | Status: AC
  Filled 2022-09-13: qty 3

## 2022-09-13 MED ORDER — TRELEGY ELLIPTA 200-62.5-25 MCG/ACT IN AEPB: 1.0000 | INHALATION_SPRAY | Freq: Every morning | RESPIRATORY_TRACT | 2 refills | Status: AC

## 2022-09-13 NOTE — Discharge Instructions (Addendum)
Please read below to learn more about the medications, dosages and frequencies that I recommend to help alleviate your symptoms and to get you feeling better soon:   Z-Pak (azithromycin): Please discontinue Augmentin.  Take 2 tablets of azithromycin the first day, then take 1 tablet daily every day thereafter until complete.       Prednisone: Please continue taking 2 tablets daily until you complete 5 days of this steroid, then discontinue.      Zyrtec (cetirizine): This is an excellent second-generation antihistamine that helps to reduce respiratory inflammatory response to environmental allergens.  In some patients, this medication can cause daytime sleepiness so I recommend that you take 1 tablet daily at bedtime.     Singulair (montelukast): This is a mast cell stabilizer that works well with antihistamines.  Mast cells are responsible for stimulating histamine production so you can imagine that if we can reduce the activity of your mast cells, then fewer histamines will be produced and inflammation caused by allergy exposure will be significantly reduced.  I recommend that you take this medication at the same time you take your antihistamine.   Flonase (fluticasone): This is a steroid nasal spray that you use once daily, 1 spray in each nare.  This medication does not work well if you decide to use it only used as you feel you need to, it works best used on a daily basis.  After 3 to 5 days of use, you will notice significant reduction of the inflammation and mucus production that is currently being caused by exposure to allergens, whether seasonal or environmental.  The most common side effect of this medication is nosebleeds.  If you experience a nosebleed, please discontinue use for 1 week, then feel free to resume.  I have provided you with a prescription.     Combivent, DuoNeb (ipratropium and albuterol): This inhaled medication contains a short acting beta agonist bronchodilator and a  muscarinic bronchodilator.  This medication works on the smooth muscle that opens and constricts of your airways by relaxing the muscle.  The result of relaxation of the smooth muscle is increased air movement and improved work of breathing.  This is a short acting medication that can be used every 4-6 hours as needed for increased work of breathing, shortness of breath, wheezing and excessive coughing.  I have provided you with a prescription for Combivent HFA to be used with the spacer that I have also prescribed.   Trelegy (fluticasone, vilanterol and umeclidinium):  This inhaled medication contains a corticosteroid and long-acting form of albuterol.  The inhaled steroid and this medication  is not absorbed into the body and will not cause side effects such as increased blood sugar levels, irritability, sleeplessness or weight gain.  Inhaled corticosteroid are sort of like topical steroid creams but, as you can imagine, it is not practical to attempt to rub a steroid cream inside of your lungs.  The long-acting albuterol works similarly to the short acting albuterol found in your rescue inhaler but provides 24-hour relaxation of the smooth muscles that open and constrict your airways; your short acting rescue inhaler can only provide for a few hours this benefit for a few hours.  The third unique ingredient, umeclidinium, is an antimuscarinic and works similarly to your albuterol, providing long-acting relaxation of the smooth muscles in your airway.  Please feel free to continue using your short acting rescue inhaler as often as needed throughout the day for shortness of breath, wheezing, and cough.  Robitussin, Mucinex (guaifenesin): This is an expectorant.  This helps break up chest congestion and loosen up thick nasal drainage making phlegm and drainage more liquid and therefore easier to remove.  I recommend being 400 mg three times daily as needed.      Please follow-up within the next 5-7 days  either with your primary care provider or urgent care if your symptoms do not resolve.  If you do not have a primary care provider, we will assist you in finding one.        Thank you for visiting urgent care today.  We appreciate the opportunity to participate in your care.

## 2022-09-13 NOTE — ED Triage Notes (Signed)
.  Chief Complaint: cough, fever, SOB. Patient was seen 09/10/22 and breathing has been worse.   Onset: 3 days  OTC medications tried: Yes- inhaler    with little relief

## 2022-09-13 NOTE — ED Provider Notes (Signed)
[facility]    CSN: [id number] Arrival date & time: 09/13/22  1333    HISTORY   Chief Complaint  Patient presents with   Shortness of Breath   Cough   HPI Regina Miller is a pleasant, 26 y.o. female who presents to urgent care today. Patient was seen 3 days ago here at urgent care complaining of cough and congestion.  Patient reported history of asthma which was not well controlled.  Patient was provided with nebulized triptan and albuterol during her visit today and discharged with a diagnosis of moderate asthma with acute exacerbation and prescriptions for prednisone 20 mg (take 2 tablets daily for 7 days, dispense 15 tablets), Augmentin x7 days, DuoNeb Respules and Tessalon Perles.  Of note, patient does not have a nebulizer machine at home, nor was when ordered for her.  Patient states that she smokes 1 pack of cigarettes per day and also uses marijuana, states she has not smoked either since the cough began.  Patient states that today her cough is worse.  The history is provided by the patient.   Past Medical History:  Diagnosis Date   Acid reflux    Anxiety    Asthma    Attention deficit disorder    Heart murmur    Reports she had a heart murmur as a baby that has been evaluated. She is not currently followed by a cardiologist.   Patient Active Problem List   Diagnosis Date Noted   Morbid obesity (HCC) 07/19/2019   First trimester pregnancy 07/26/2017   Anxiety 12/22/2013   Eczema 12/22/2013   Body mass index, pediatric, greater than or 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 age 49/02/2014   Acne 04/26/2012   Insomnia 07/06/2011   GERD 09/19/2008   ATTENTION DEFICIT, W/HYPERACTIVITY 12/17/2006   Past Surgical History:  Procedure Laterality Date   TONSILLECTOMY     TYMPANOSTOMY TUBE PLACEMENT Bilateral    OB History     Gravida  1   Para      Term      Preterm      AB      Living         SAB      IAB      Ectopic      Multiple       Live Births             Home Medications    Prior to Admission medications   Medication Sig Start Date End Date Taking? Authorizing Provider  albuterol (VENTOLIN HFA) 108 (90 Base) MCG/ACT inhaler Inhale 1-2 puffs into the lungs every 6 (six) hours as needed for wheezing or shortness of breath. 06/28/21   Ward, Tylene Fantasia, PA-C  amoxicillin-clavulanate (AUGMENTIN) 875-125 MG tablet Take 1 tablet by mouth every 12 (twelve) hours. 09/10/22   Barbette Merino, NP  benzonatate (TESSALON) 100 MG capsule Take 1 capsule (100 mg total) by mouth every 8 (eight) hours. 09/10/22   Barbette Merino, NP  HYDROcodone-acetaminophen (NORCO/VICODIN) 5-325 MG tablet Take 1-2 tablets by mouth every 6 (six) hours as needed. 07/23/21   Prosperi, Christian H, PA-C  ibuprofen (ADVIL) 800 MG tablet Take 1 tablet (800 mg total) by mouth every 8 (eight) hours as needed. 01/02/21   Mickie Bail, NP  ipratropium-albuterol (DUONEB) 0.5-2.5 (3) MG/3ML SOLN Take 3 mLs by nebulization every 6 (six) hours as needed. 09/10/22 10/10/22  Barbette Merino, NP  megestrol (MEGACE) 40 MG tablet Take 1 tablet (  40 mg total) by mouth daily. 09/29/21   Avegno, Zachery DakinsKomlanvi S, FNP  naproxen (NAPROSYN) 500 MG tablet Take 1 tablet (500 mg total) by mouth 2 (two) times daily. 06/08/20   Wieters, Hallie C, PA-C  predniSONE (DELTASONE) 10 MG tablet Take 2 tablets (20 mg total) by mouth daily. 09/10/22   Barbette MerinoKing, Crystal M, NP  sodium chloride (OCEAN) 0.65 % SOLN nasal spray Place 2 sprays into both nostrils every 2 (two) hours while awake. 07/19/19 08/18/19  Betancourt, Jarold Songina A, NP  diphenhydrAMINE (BENADRYL) 25 mg capsule Take 1 capsule (25 mg total) by mouth every 8 (eight) hours as needed for allergies. 07/19/19 10/18/19  Betancourt, Jarold Songina A, NP  loratadine (CLARITIN) 10 MG tablet Take 1 tablet (10 mg total) by mouth daily. 07/19/19 10/18/19  Betancourt, Jarold Songina A, NP    Family History Family History  Problem Relation Age of Onset   Psoriasis Mother     Hypertension Father    Eczema Father    Diabetes Father    Heart disease Paternal Grandmother    Cancer Maternal Grandmother    Social History Social History   Tobacco Use   Smoking status: Every Day    Packs/day: 1.00    Types: Cigarettes   Smokeless tobacco: Never   Tobacco comments:    Parents smoke around her  Vaping Use   Vaping Use: Never used  Substance Use Topics   Alcohol use: Not Currently    Comment: occ   Drug use: Not Currently    Types: Marijuana   Allergies   Kiwi extract and Other  Review of Systems Review of Systems Pertinent findings revealed after performing a 14 point review of systems has been noted in the history of present illness.  Physical Exam Triage Vital Signs ED Triage Vitals  Enc Vitals Group     BP 08/16/21 0827 (!) 147/82     Pulse Rate 08/16/21 0827 72     Resp 08/16/21 0827 18     Temp 08/16/21 0827 98.3 F (36.8 C)     Temp Source 08/16/21 0827 Oral     SpO2 08/16/21 0827 98 %     Weight --      Height --      Head Circumference --      Peak Flow --      Pain Score 08/16/21 0826 5     Pain Loc --      Pain Edu? --      Excl. in GC? --   No data found.  Updated Vital Signs BP (!) 140/82 (BP Location: Left Wrist)   Pulse 99   Temp 98.8 F (37.1 C) (Oral)   Resp 16   SpO2 97%   Physical Exam Vitals and nursing note reviewed.  Constitutional:      General: She is not in acute distress.    Appearance: Normal appearance. She is not ill-appearing.  HENT:     Head: Normocephalic and atraumatic.     Salivary Glands: Right salivary gland is not diffusely enlarged or tender. Left salivary gland is not diffusely enlarged or tender.     Right Ear: Ear canal and external ear normal. No drainage. A middle ear effusion is present. There is no impacted cerumen. Tympanic membrane is bulging. Tympanic membrane is not injected or erythematous.     Left Ear: Ear canal and external ear normal. No drainage. A middle ear effusion is  present. There is no impacted cerumen. Tympanic membrane is bulging. Tympanic  membrane is not injected or erythematous.     Ears:     Comments: Bilateral EACs normal, both TMs bulging with clear fluid    Nose: Rhinorrhea present. No nasal deformity, septal deviation, signs of injury, nasal tenderness, mucosal edema or congestion. Rhinorrhea is clear.     Right Nostril: Occlusion present. No foreign body, epistaxis or septal hematoma.     Left Nostril: Occlusion present. No foreign body, epistaxis or septal hematoma.     Right Turbinates: Enlarged, swollen and pale.     Left Turbinates: Enlarged, swollen and pale.     Right Sinus: No maxillary sinus tenderness or frontal sinus tenderness.     Left Sinus: No maxillary sinus tenderness or frontal sinus tenderness.     Mouth/Throat:     Lips: Pink. No lesions.     Mouth: Mucous membranes are moist. No oral lesions.     Pharynx: Oropharynx is clear. Uvula midline. No posterior oropharyngeal erythema or uvula swelling.     Tonsils: No tonsillar exudate. 0 on the right. 0 on the left.     Comments: Postnasal drip Eyes:     General: Lids are normal.        Right eye: No discharge.        Left eye: No discharge.     Extraocular Movements: Extraocular movements intact.     Conjunctiva/sclera: Conjunctivae normal.     Right eye: Right conjunctiva is not injected.     Left eye: Left conjunctiva is not injected.  Neck:     Trachea: Trachea and phonation normal.  Cardiovascular:     Rate and Rhythm: Normal rate and regular rhythm.     Pulses: Normal pulses.     Heart sounds: Normal heart sounds. No murmur heard.    No friction rub. No gallop.  Pulmonary:     Effort: Pulmonary effort is normal. No tachypnea, bradypnea, accessory muscle usage, prolonged expiration, respiratory distress or retractions.     Breath sounds: No stridor, decreased air movement or transmitted upper airway sounds. Examination of the right-upper field reveals wheezing.  Examination of the left-upper field reveals wheezing. Wheezing present. No decreased breath sounds, rhonchi or rales.  Chest:     Chest wall: No tenderness.  Musculoskeletal:        General: Normal range of motion.     Cervical back: Normal range of motion and neck supple. Normal range of motion.  Lymphadenopathy:     Cervical: No cervical adenopathy.  Skin:    General: Skin is warm and dry.     Findings: No erythema or rash.  Neurological:     General: No focal deficit present.     Mental Status: She is alert and oriented to person, place, and time.  Psychiatric:        Mood and Affect: Mood normal.        Behavior: Behavior normal.     Visual Acuity Right Eye Distance:   Left Eye Distance:   Bilateral Distance:    Right Eye Near:   Left Eye Near:    Bilateral Near:     UC Couse / Diagnostics / Procedures:     Radiology No results found.  Procedures Procedures (including critical care time) EKG  Pending results:  Labs Reviewed - No data to display  Medications Ordered in UC: Medications  ipratropium-albuterol (DUONEB) 0.5-2.5 (3) MG/3ML nebulizer solution 3 mL (3 mLs Nebulization Given 09/13/22 1522)    UC Diagnoses / Final Clinical Impressions(s)  I have reviewed the triage vital signs and the nursing notes.  Pertinent labs & imaging results that were available during my care of the patient were reviewed by me and considered in my medical decision making (see chart for details).    Final diagnoses:  Tobacco dependence due to cigarettes  Moderate persistent asthma with acute exacerbation  Cough productive of yellow sputum   O2 sats are 97% on arrival, patient has a blood pressure of 140/82 on arrival as well.  Patient advised to complete 5 days of prednisone and then discontinue remaining tablets.  Patient also advised to discontinue Augmentin as it will not assist with decreasing respiratory inflammation as well as azithromycin which has been sent instead.   Patient provided with a repeat DuoNeb during her visit today as well as a prescription for a Combivent inhaler to use every 6 hours as needed for cough and shortness of breath.  Finally, patient provided with Trelegy to inhale once daily for maintenance of her COPD.  Patient advised to be contacted by one of our clinical staff to help her schedule appointment with a brand-new primary care provider.  Return precautions advised.   ED Prescriptions     Medication Sig Dispense Auth. Provider   Fluticasone-Umeclidin-Vilant (TRELEGY ELLIPTA) 200-62.5-25 MCG/ACT AEPB Inhale 1 puff into the lungs in the morning. 1 each Theadora Rama Scales, PA-C   Ipratropium-Albuterol (COMBIVENT) 20-100 MCG/ACT AERS respimat Inhale 1 puff into the lungs every 6 (six) hours as needed for wheezing. 4 g Theadora Rama Scales, PA-C   azithromycin (ZITHROMAX) 250 MG tablet Take 2 tablets (500 mg total) by mouth daily for 1 day, THEN 1 tablet (250 mg total) daily for 4 days. 6 tablet Theadora Rama Scales, PA-C   cetirizine (ZYRTEC ALLERGY) 10 MG tablet Take 1 tablet (10 mg total) by mouth at bedtime. 30 tablet Theadora Rama Scales, PA-C   fluticasone (FLONASE) 50 MCG/ACT nasal spray Place 1 spray into both nostrils daily. Begin by using 2 sprays in each nare daily for 3 to 5 days, then decrease to 1 spray in each nare daily. 15.8 mL Theadora Rama Scales, PA-C   montelukast (SINGULAIR) 10 MG tablet Take 1 tablet (10 mg total) by mouth at bedtime. 30 tablet Theadora Rama Scales, PA-C      PDMP not reviewed this encounter.  Disposition Upon Discharge:  Condition: stable for discharge home Home: take medications as prescribed; routine discharge instructions as discussed; follow up as advised.  Patient presented with an acute illness with associated systemic symptoms and significant discomfort requiring urgent management. In my opinion, this is a condition that a prudent lay person (someone who possesses an average  knowledge of health and medicine) may potentially expect to result in complications if not addressed urgently such as respiratory distress, impairment of bodily function or dysfunction of bodily organs.   Routine symptom specific, illness specific and/or disease specific instructions were discussed with the patient and/or caregiver at length.   As such, the patient has been evaluated and assessed, work-up was performed and treatment was provided in alignment with urgent care protocols and evidence based medicine.  Patient/parent/caregiver has been advised that the patient may require follow up for further testing and treatment if the symptoms continue in spite of treatment, as clinically indicated and appropriate.  If the patient was tested for COVID-19, Influenza and/or RSV, then the patient/parent/guardian was advised to isolate at home pending the results of his/her diagnostic coronavirus test and potentially longer if they're positive. I  have also advised pt that if his/her COVID-19 test returns positive, it's recommended to self-isolate for at least 10 days after symptoms first appeared AND until fever-free for 24 hours without fever reducer AND other symptoms have improved or resolved. Discussed self-isolation recommendations as well as instructions for household member/close contacts as per the [facility] and [place] [facility], and also gave patient the COVID packet with this information.  Patient/parent/caregiver has been advised to return to the [facility] or PCP in 3-5 days if no better; to PCP or the Emergency Department if new signs and symptoms develop, or if the current signs or symptoms continue to change or worsen for further workup, evaluation and treatment as clinically indicated and appropriate  The patient will follow up with their current PCP if and as advised. If the patient does not currently have a PCP we will assist them in obtaining one.   The patient may need specialty follow up if the symptoms  continue, in spite of conservative treatment and management, for further workup, evaluation, consultation and treatment as clinically indicated and appropriate.  Patient/parent/caregiver verbalized understanding and agreement of plan as discussed.  All questions were addressed during visit.  Please see discharge instructions below for further details of plan.  Discharge Instructions:   Discharge Instructions      Please read below to learn more about the medications, dosages and frequencies that I recommend to help alleviate your symptoms and to get you feeling better soon:   Z-Pak (azithromycin): Please discontinue Augmentin.  Take 2 tablets of azithromycin the first day, then take 1 tablet daily every day thereafter until complete.       Prednisone: Please continue taking 2 tablets daily until you complete 5 days of this steroid, then discontinue.      Zyrtec (cetirizine): This is an excellent second-generation antihistamine that helps to reduce respiratory inflammatory response to environmental allergens.  In some patients, this medication can cause daytime sleepiness so I recommend that you take 1 tablet daily at bedtime.     Singulair (montelukast): This is a mast cell stabilizer that works well with antihistamines.  Mast cells are responsible for stimulating histamine production so you can imagine that if we can reduce the activity of your mast cells, then fewer histamines will be produced and inflammation caused by allergy exposure will be significantly reduced.  I recommend that you take this medication at the same time you take your antihistamine.   Flonase (fluticasone): This is a steroid nasal spray that you use once daily, 1 spray in each nare.  This medication does not work well if you decide to use it only used as you feel you need to, it works best used on a daily basis.  After 3 to 5 days of use, you will notice significant reduction of the inflammation and mucus production that  is currently being caused by exposure to allergens, whether seasonal or environmental.  The most common side effect of this medication is nosebleeds.  If you experience a nosebleed, please discontinue use for 1 week, then feel free to resume.  I have provided you with a prescription.     Combivent, DuoNeb (ipratropium and albuterol): This inhaled medication contains a short acting beta agonist bronchodilator and a muscarinic bronchodilator.  This medication works on the smooth muscle that opens and constricts of your airways by relaxing the muscle.  The result of relaxation of the smooth muscle is increased air movement and improved work of breathing.  This is a short  acting medication that can be used every 4-6 hours as needed for increased work of breathing, shortness of breath, wheezing and excessive coughing.  I have provided you with a prescription for Combivent HFA to be used with the spacer that I have also prescribed.   Trelegy (fluticasone, vilanterol and umeclidinium):  This inhaled medication contains a corticosteroid and long-acting form of albuterol.  The inhaled steroid and this medication  is not absorbed into the body and will not cause side effects such as increased blood sugar levels, irritability, sleeplessness or weight gain.  Inhaled corticosteroid are sort of like topical steroid creams but, as you can imagine, it is not practical to attempt to rub a steroid cream inside of your lungs.  The long-acting albuterol works similarly to the short acting albuterol found in your rescue inhaler but provides 24-hour relaxation of the smooth muscles that open and constrict your airways; your short acting rescue inhaler can only provide for a few hours this benefit for a few hours.  The third unique ingredient, umeclidinium, is an antimuscarinic and works similarly to your albuterol, providing long-acting relaxation of the smooth muscles in your airway.  Please feel free to continue using your short  acting rescue inhaler as often as needed throughout the day for shortness of breath, wheezing, and cough.    Robitussin, Mucinex (guaifenesin): This is an expectorant.  This helps break up chest congestion and loosen up thick nasal drainage making phlegm and drainage more liquid and therefore easier to remove.  I recommend being 400 mg three times daily as needed.      Please follow-up within the next 5-7 days either with your primary care provider or urgent care if your symptoms do not resolve.  If you do not have a primary care provider, we will assist you in finding one.        Thank you for visiting urgent care today.  We appreciate the opportunity to participate in your care.         This office note has been dictated using Teaching laboratory technician.  Unfortunately, this method of dictation can sometimes lead to typographical or grammatical errors.  I apologize for your inconvenience in advance if this occurs.  Please do not hesitate to reach out to me if clarification is needed.      Theadora Rama Scales, PA-C 09/13/22 1528

## 2022-12-01 ENCOUNTER — Ambulatory Visit: Payer: 59 | Admitting: Internal Medicine

## 2023-01-03 ENCOUNTER — Encounter (HOSPITAL_COMMUNITY): Payer: Self-pay | Admitting: Psychiatry

## 2023-01-03 ENCOUNTER — Ambulatory Visit (HOSPITAL_COMMUNITY)
Admission: EM | Admit: 2023-01-03 | Discharge: 2023-01-03 | Disposition: A | Discharge status: Home / Self Care | Payer: 59

## 2023-01-03 DIAGNOSIS — R4586 Emotional lability: Secondary | ICD-10-CM
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
## 2023-01-03 NOTE — Discharge Instructions (Signed)
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
## 2023-01-03 NOTE — ED Triage Notes (Signed)
Pt presents to [facility] vountarily. Pt reports daily mood swings from happy to sad. Pt reports experinecing depression. Pt reports being diagnosed with ADHD at 26 and not takingmedication. Pt reports crying spells throughout the day and low motivation. Pt does endorse SI without a plan for approximately two days. Pt reports being tired from crying so much. Pt denies HI and AVH. Pt is urgent.

## 2023-01-19 ENCOUNTER — Other Ambulatory Visit: Payer: Self-pay

## 2023-01-19 ENCOUNTER — Encounter (HOSPITAL_COMMUNITY): Payer: Self-pay | Admitting: *Deleted

## 2023-01-19 ENCOUNTER — Ambulatory Visit (HOSPITAL_COMMUNITY)
Admission: EM | Admit: 2023-01-19 | Discharge: 2023-01-19 | Disposition: A | Discharge status: Home / Self Care | Payer: 59 | Attending: Physician Assistant | Admitting: Physician Assistant

## 2023-01-19 DIAGNOSIS — H6993 Unspecified Eustachian tube disorder, bilateral: Secondary | ICD-10-CM

## 2023-01-19 DIAGNOSIS — K047 Periapical abscess without sinus: Secondary | ICD-10-CM

## 2023-01-19 MED ORDER — FLUTICASONE PROPIONATE 50 MCG/ACT NA SUSP: 1.0000 | Freq: Every day | NASAL | 0 refills | Status: DC

## 2023-01-19 MED ORDER — CETIRIZINE HCL 10 MG PO TABS: 10.0000 mg | ORAL_TABLET | Freq: Every day | ORAL | 2 refills | Status: DC

## 2023-01-19 MED ORDER — AMOXICILLIN-POT CLAVULANATE 875-125 MG PO TABS: 1.0000 | ORAL_TABLET | Freq: Two times a day (BID) | ORAL | 0 refills | Status: DC

## 2023-01-19 NOTE — ED Provider Notes (Signed)
[facility]    CSN: [id number] Arrival date & time: 01/19/23  [place]      History   Chief Complaint Chief Complaint  Patient presents with   Dental Problem   ear pressure    HPI Regina Miller is a 27 y.o. female.   Patient presents today with several concerns.  Her primary concern today is a 2-day history of worsening right lower jaw pain.  She had a missing tooth this area that has gradually been swelling and worsening until the past few days.  She believes that she had an abscess and thinks that it might have burst.  She has not taken any over-the-counter medication for symptom management.  She does report some discomfort but denies any significant pain.  Denies any fever, nausea, vomiting, swelling of her throat, muffled voice, shortness of breath.  She has not seen a dentist recently and denies any recent dental procedures.  She is confident she is not pregnant she is currently on her menstrual cycle.  In addition, she reports a several day history of bilateral otalgia.  She describes this as a sensation of fullness.  She denies any history of allergies or additional symptoms including cough and congestion.  She does not take any antihistamines on a regular basis.  Denies any recent swimming or airplane travel.  Denies any associated otorrhea.    Past Medical History:  Diagnosis Date   Acid reflux    Anxiety    Asthma    Attention deficit disorder    Heart murmur    Reports she had a heart murmur as a baby that has been evaluated. She is not currently followed by a cardiologist.    Patient Active Problem List   Diagnosis Date Noted   Morbid obesity 07/19/2019   First trimester pregnancy 07/26/2017   Anxiety 12/22/2013   Eczema 12/22/2013   Body mass index, pediatric, greater than or 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 age 41/02/2014   Acne 04/26/2012   Insomnia 07/06/2011   GERD 09/19/2008   ATTENTION DEFICIT, W/HYPERACTIVITY 12/17/2006    Past Surgical  History:  Procedure Laterality Date   TONSILLECTOMY     TYMPANOSTOMY TUBE PLACEMENT Bilateral     OB History     Gravida  1   Para      Term      Preterm      AB      Living         SAB      IAB      Ectopic      Multiple      Live Births               Home Medications    Prior to Admission medications   Medication Sig Start Date End Date Taking? Authorizing Provider  amoxicillin-clavulanate (AUGMENTIN) 875-125 MG tablet Take 1 tablet by mouth every 12 (twelve) hours. 01/19/23  Yes Weyman Bogdon, Derry Skill, PA-C  cetirizine (ZYRTEC ALLERGY) 10 MG tablet Take 1 tablet (10 mg total) by mouth at bedtime. 01/19/23 04/19/23  Edrian Melucci, Derry Skill, PA-C  fluticasone (FLONASE) 50 MCG/ACT nasal spray Place 1 spray into both nostrils daily. Begin by using 2 sprays in each nare daily for 3 to 5 days, then decrease to 1 spray in each nare daily. 01/19/23   Arther Heisler K, PA-C  Ipratropium-Albuterol (COMBIVENT) 20-100 MCG/ACT AERS respimat Inhale 1 puff into the lungs every 6 (six) hours as needed for wheezing. 09/13/22   Lilia Pro,  Lindsay Scales, PA-C  montelukast (SINGULAIR) 10 MG tablet Take 1 tablet (10 mg total) by mouth at bedtime. 09/13/22 12/12/22  Lynden Oxford Scales, PA-C  predniSONE (DELTASONE) 10 MG tablet Take 2 tablets (20 mg total) by mouth daily. 09/10/22   Vevelyn Francois, NP  diphenhydrAMINE (BENADRYL) 25 mg capsule Take 1 capsule (25 mg total) by mouth every 8 (eight) hours as needed for allergies. 07/19/19 10/18/19  Betancourt, Aura Fey, NP  loratadine (CLARITIN) 10 MG tablet Take 1 tablet (10 mg total) by mouth daily. 07/19/19 10/18/19  Betancourt, Aura Fey, NP    Family History Family History  Problem Relation Age of Onset   Psoriasis Mother    Hypertension Father    Eczema Father    Diabetes Father    Heart disease Paternal Grandmother    Cancer Maternal Grandmother     Social History Social History   Tobacco Use   Smoking status: Every Day    Packs/day: 1    Types:  Cigarettes   Smokeless tobacco: Never   Tobacco comments:    Parents smoke around her  Vaping Use   Vaping Use: Never used  Substance Use Topics   Alcohol use: Not Currently    Comment: occ   Drug use: Not Currently    Types: Marijuana     Allergies   Kiwi extract and Other   Review of Systems Review of Systems  Constitutional:  Positive for activity change. Negative for appetite change, fatigue and fever.  HENT:  Positive for dental problem, ear pain and facial swelling. Negative for congestion, sinus pressure, sneezing, sore throat, trouble swallowing and voice change.   Respiratory:  Negative for shortness of breath.   Cardiovascular:  Negative for chest pain.  Gastrointestinal:  Negative for abdominal pain, diarrhea, nausea and vomiting.  Neurological:  Negative for dizziness, light-headedness and headaches.     Physical Exam Triage Vital Signs ED Triage Vitals  Enc Vitals Group     BP 01/19/23 1742 100/64     Pulse Rate 01/19/23 1742 79     Resp 01/19/23 1742 18     Temp 01/19/23 1742 98.1 F (36.7 C)     Temp src --      SpO2 01/19/23 1742 96 %     Weight --      Height --      Head Circumference --      Peak Flow --      Pain Score 01/19/23 1739 0     Pain Loc --      Pain Edu? --      Excl. in [place]? --    No data found.  Updated Vital Signs BP 100/64   Pulse 79   Temp 98.1 F (36.7 C)   Resp 18   LMP 01/19/2023   SpO2 96%   Visual Acuity Right Eye Distance:   Left Eye Distance:   Bilateral Distance:    Right Eye Near:   Left Eye Near:    Bilateral Near:     Physical Exam Vitals reviewed.  Constitutional:      General: She is awake. She is not in acute distress.    Appearance: Normal appearance. She is well-developed. She is not ill-appearing.     Comments: Very pleasant female appears stated age in no acute distress sitting comfortably in exam room  HENT:     Head: Normocephalic and atraumatic.     Right Ear: Ear canal and external  ear normal. A  middle ear effusion is present. Tympanic membrane is scarred. Tympanic membrane is not erythematous or bulging.     Left Ear: Ear canal and external ear normal. A middle ear effusion is present. Tympanic membrane is scarred. Tympanic membrane is not erythematous or bulging.     Nose:     Right Sinus: No maxillary sinus tenderness or frontal sinus tenderness.     Left Sinus: No maxillary sinus tenderness or frontal sinus tenderness.     Mouth/Throat:     Pharynx: Uvula midline. No oropharyngeal exudate or posterior oropharyngeal erythema.  Cardiovascular:     Rate and Rhythm: Normal rate and regular rhythm.     Heart sounds: Normal heart sounds, S1 normal and S2 normal. No murmur heard. Pulmonary:     Effort: Pulmonary effort is normal.     Breath sounds: Normal breath sounds. No wheezing, rhonchi or rales.     Comments: Clear to auscultation bilaterally Psychiatric:        Behavior: Behavior is cooperative.      UC Treatments / Results  Labs (all labs ordered are listed, but only abnormal results are displayed) Labs Reviewed - No data to display  EKG   Radiology No results found.  Procedures Procedures (including critical care time)  Medications Ordered in UC Medications - No data to display  Initial Impression / Assessment and Plan / UC Course  I have reviewed the triage vital signs and the nursing notes.  Pertinent labs & imaging results that were available during my care of the patient were reviewed by me and considered in my medical decision making (see chart for details).     Patient is well-appearing, afebrile, nontoxic, nontachycardic.  She was treated for dental infection with Augmentin twice daily for 7 days.  Recommend she use over-the-counter medication for symptom management including Tylenol ibuprofen.  She can also gargle with warm salt water for pain relief.  Discussed that ultimately she will need to see a dentist and was given contact  information for local provider with instruction to call to schedule an appointment.  If she has any worsening symptoms including dysphagia, muffled voice, shortness of breath, swelling of her throat, fever she is to be seen immediately.  No evidence of otitis media on exam.  Patient was started on allergy medication including oral antihistamine and intranasal steroid to help manage eustachian tube dysfunction.  Discussed that if she has any worsening or changing symptoms she should return for reevaluation.  Offered her a work excuse note which she declined.  Final Clinical Impressions(s) / UC Diagnoses   Final diagnoses:  Dental infection  Eustachian tube dysfunction, bilateral     Discharge Instructions      Start Augmentin twice daily for 7 days.  Use over-the-counter Tylenol ibuprofen for pain.  Gargle with warm salt water for additional symptom relief.  You should follow-up with dentist; call to schedule an appointment.  If you develop any worsening symptoms including difficulty swallowing, difficulty speaking, swelling of your throat, high fever, change in your voice you need to be seen immediately.  I believe that your ear pain is related to eustachian tube dysfunction.  Start cetirizine daily and use Flonase 1 spray in each nostril twice daily.  If you develop any severe ear pain, drainage from the ear, fever, nausea, vomiting you need to be seen immediately.      ED Prescriptions     Medication Sig Dispense Auth. Provider   cetirizine (ZYRTEC ALLERGY) 10 MG tablet Take  1 tablet (10 mg total) by mouth at bedtime. 30 tablet Lavergne Hiltunen K, PA-C   fluticasone (FLONASE) 50 MCG/ACT nasal spray Place 1 spray into both nostrils daily. Begin by using 2 sprays in each nare daily for 3 to 5 days, then decrease to 1 spray in each nare daily. 16 g Dareld Mcauliffe K, PA-C   amoxicillin-clavulanate (AUGMENTIN) 875-125 MG tablet Take 1 tablet by mouth every 12 (twelve) hours. 14 tablet Saburo Luger, Derry Skill, PA-C      PDMP not reviewed this encounter.   Terrilee Croak, PA-C 01/19/23 1820

## 2023-01-19 NOTE — Discharge Instructions (Signed)
Start Augmentin twice daily for 7 days.  Use over-the-counter Tylenol ibuprofen for pain.  Gargle with warm salt water for additional symptom relief.  You should follow-up with dentist; call to schedule an appointment.  If you develop any worsening symptoms including difficulty swallowing, difficulty speaking, swelling of your throat, high fever, change in your voice you need to be seen immediately.  I believe that your ear pain is related to eustachian tube dysfunction.  Start cetirizine daily and use Flonase 1 spray in each nostril twice daily.  If you develop any severe ear pain, drainage from the ear, fever, nausea, vomiting you need to be seen immediately.

## 2023-01-19 NOTE — ED Triage Notes (Addendum)
Pt reports dental infection on RT lower side for one month. Pt also reports bil ear pressure.

## 2023-02-03 ENCOUNTER — Ambulatory Visit (HOSPITAL_BASED_OUTPATIENT_CLINIC_OR_DEPARTMENT_OTHER): Payer: 59 | Admitting: Psychiatry

## 2023-02-03 ENCOUNTER — Encounter (HOSPITAL_COMMUNITY): Payer: Self-pay | Admitting: Psychiatry

## 2023-02-03 DIAGNOSIS — F4310 Post-traumatic stress disorder, unspecified: Secondary | ICD-10-CM | POA: Insufficient documentation

## 2023-02-03 DIAGNOSIS — F603 Borderline personality disorder: Secondary | ICD-10-CM | POA: Diagnosis not present

## 2023-02-03 MED ORDER — PROPRANOLOL HCL 10 MG PO TABS: 10.0000 mg | ORAL_TABLET | Freq: Three times a day (TID) | ORAL | 1 refills | Status: DC

## 2023-02-03 MED ORDER — ESCITALOPRAM OXALATE 5 MG PO TABS: 5.0000 mg | ORAL_TABLET | Freq: Every day | ORAL | 1 refills | Status: DC
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
## 2023-02-04 ENCOUNTER — Other Ambulatory Visit: Payer: Self-pay

## 2023-02-04 ENCOUNTER — Encounter (HOSPITAL_COMMUNITY): Payer: Self-pay

## 2023-02-04 ENCOUNTER — Emergency Department (HOSPITAL_COMMUNITY)
Admission: EM | Admit: 2023-02-04 | Discharge: 2023-02-05 | Disposition: A | Discharge status: Home / Self Care | Payer: 59 | Attending: Emergency Medicine | Admitting: Emergency Medicine

## 2023-02-04 DIAGNOSIS — R42 Dizziness and giddiness: Secondary | ICD-10-CM

## 2023-02-04 DIAGNOSIS — T447X5A Adverse effect of beta-adrenoreceptor antagonists, initial encounter: Secondary | ICD-10-CM | POA: Diagnosis not present

## 2023-02-04 DIAGNOSIS — Z7951 Long term (current) use of inhaled steroids: Secondary | ICD-10-CM | POA: Diagnosis not present

## 2023-02-04 DIAGNOSIS — T43225A Adverse effect of selective serotonin reuptake inhibitors, initial encounter: Secondary | ICD-10-CM | POA: Diagnosis not present

## 2023-02-04 DIAGNOSIS — J45909 Unspecified asthma, uncomplicated: Secondary | ICD-10-CM | POA: Insufficient documentation

## 2023-02-04 DIAGNOSIS — R112 Nausea with vomiting, unspecified: Secondary | ICD-10-CM | POA: Diagnosis not present

## 2023-02-04 DIAGNOSIS — T887XXA Unspecified adverse effect of drug or medicament, initial encounter: Secondary | ICD-10-CM

## 2023-02-04 NOTE — ED Triage Notes (Signed)
Pt reports that she started some new medications yesterday for depression and since has been feeling dizzy and lightheaded, vomited x 1. Pt reports tingling and pain in both of her feet. Denies SI/HI

## 2023-02-05 LAB — CBC
HCT: 39.7 % (ref 36.0–46.0)
Hemoglobin: 13 g/dL (ref 12.0–15.0)
MCH: 32.2 pg (ref 26.0–34.0)
MCHC: 32.7 g/dL (ref 30.0–36.0)
MCV: 98.3 fL (ref 80.0–100.0)
Platelets: 339 10*3/uL (ref 150–400)
RBC: 4.04 MIL/uL (ref 3.87–5.11)
RDW: 12.6 % (ref 11.5–15.5)
WBC: 13.4 10*3/uL — ABNORMAL HIGH (ref 4.0–10.5)
nRBC: 0 % (ref 0.0–0.2)

## 2023-02-05 LAB — BASIC METABOLIC PANEL
Anion gap: 10 (ref 5–15)
BUN: 14 mg/dL (ref 6–20)
CO2: 22 mmol/L (ref 22–32)
Calcium: 9.3 mg/dL (ref 8.9–10.3)
Chloride: 106 mmol/L (ref 98–111)
Creatinine, Ser: 0.89 mg/dL (ref 0.44–1.00)
GFR, Estimated: >60 mL/min (ref >60)
Glucose, Bld: 97 mg/dL (ref 70–99)
Potassium: 4.6 mmol/L (ref 3.5–5.1)
Sodium: 138 mmol/L (ref 135–145)

## 2023-02-05 LAB — URINALYSIS, ROUTINE W REFLEX MICROSCOPIC
Bilirubin Urine: NEGATIVE
Glucose, UA: NEGATIVE mg/dL
Ketones, ur: NEGATIVE mg/dL
Nitrite: NEGATIVE
Protein, ur: NEGATIVE mg/dL
Specific Gravity, Urine: 1.019 (ref 1.005–1.030)
pH: 6 (ref 5.0–8.0)

## 2023-02-05 LAB — CBG MONITORING, ED: Glucose-Capillary: 97 mg/dL (ref 70–99)

## 2023-02-05 LAB — I-STAT BETA HCG BLOOD, ED (MC, WL, AP ONLY): I-stat hCG, quantitative: <5 m[IU]/mL (ref <5)

## 2023-02-05 MED ORDER — ONDANSETRON 4 MG PO TBDP
4.0000 mg | ORAL_TABLET | Freq: Once | ORAL | Status: AC
  Administered 2023-02-05: 4 mg via ORAL
  Filled 2023-02-05: qty 1

## 2023-02-05 NOTE — Discharge Instructions (Signed)
I would hold on your medications for now until you speak with your doctor about how he would like you proceed. Make sure to rest, hydrate well, eat regularly. Return here for any new/acute changes.

## 2023-02-05 NOTE — ED Provider Notes (Signed)
[place] EMERGENCY DEPARTMENT AT [facility] Provider Note   CSN: [id number] Arrival date & time: 02/04/23  2327     History  Chief Complaint  Patient presents with   Dizziness    Regina Miller is a 27 y.o. female.   Dizziness Associated symptoms: nausea and vomiting    27 year old female with history of ADD, anxiety, asthma, acid reflux, presenting to the ED with medication side effects.  She saw a psychiatrist 2 days ago for some worsening anxiety/depressive symptoms and was started on propranolol and Lexapro.  States she first took the medications yesterday and since then has been feeling lightheaded, intermittently dizzy, nauseated, and has vomited once.  She states her feet also feel "tingly".  She denies any focal numbness or weakness.  She has never taken psychiatric medications before in the past.  She denies any chest pain or shortness of breath.  Home Medications Prior to Admission medications   Medication Sig Start Date End Date Taking? Authorizing Provider  amoxicillin-clavulanate (AUGMENTIN) 875-125 MG tablet Take 1 tablet by mouth every 12 (twelve) hours. 01/19/23   Raspet, Noberto Retort, PA-C  cetirizine (ZYRTEC ALLERGY) 10 MG tablet Take 1 tablet (10 mg total) by mouth at bedtime. 01/19/23 04/19/23  Raspet, Noberto Retort, PA-C  escitalopram (LEXAPRO) 5 MG tablet Take 1 tablet (5 mg total) by mouth daily. 02/03/23   Stasia Cavalier, MD  fluticasone [facility]) 50 MCG/ACT nasal spray Place 1 spray into both nostrils daily. Begin by using 2 sprays in each nare daily for 3 to 5 days, then decrease to 1 spray in each nare daily. 01/19/23   Raspet, Erin K, PA-C  Ipratropium-Albuterol (COMBIVENT) 20-100 MCG/ACT AERS respimat Inhale 1 puff into the lungs every 6 (six) hours as needed for wheezing. 09/13/22   Theadora Rama Scales, PA-C  montelukast (SINGULAIR) 10 MG tablet Take 1 tablet (10 mg total) by mouth at bedtime. 09/13/22 12/12/22  Theadora Rama Scales, PA-C  predniSONE  (DELTASONE) 10 MG tablet Take 2 tablets (20 mg total) by mouth daily. 09/10/22   Barbette Merino, NP  propranolol (INDERAL) 10 MG tablet Take 1 tablet (10 mg total) by mouth 3 (three) times daily. 02/03/23   Stasia Cavalier, MD  diphenhydrAMINE (BENADRYL) 25 mg capsule Take 1 capsule (25 mg total) by mouth every 8 (eight) hours as needed for allergies. 07/19/19 10/18/19  Betancourt, Jarold Song, NP  loratadine (CLARITIN) 10 MG tablet Take 1 tablet (10 mg total) by mouth daily. 07/19/19 10/18/19  Betancourt, Jarold Song, NP      Allergies    Kiwi extract and Other    Review of Systems   Review of Systems  Gastrointestinal:  Positive for nausea and vomiting.  Neurological:  Positive for dizziness and light-headedness.  All other systems reviewed and are negative.   Physical Exam Updated Vital Signs BP 121/73 (BP Location: Right Arm)   Pulse 75   Temp 97.8 F (36.6 C)   Resp 20   LMP 01/19/2023   SpO2 97%   Physical Exam Vitals and nursing note reviewed.  Constitutional:      Appearance: She is well-developed.  HENT:     Head: Normocephalic and atraumatic.  Eyes:     Conjunctiva/sclera: Conjunctivae normal.     Pupils: Pupils are equal, round, and reactive to light.  Cardiovascular:     Rate and Rhythm: Normal rate and regular rhythm.     Heart sounds: Normal heart sounds.  Pulmonary:  Effort: Pulmonary effort is normal. No respiratory distress.     Breath sounds: Normal breath sounds. No rhonchi.  Abdominal:     General: Bowel sounds are normal.     Palpations: Abdomen is soft.     Tenderness: There is no abdominal tenderness. There is no rebound.  Musculoskeletal:        General: Normal range of motion.     Cervical back: Normal range of motion.  Skin:    General: Skin is warm and dry.  Neurological:     Mental Status: She is alert and oriented to person, place, and time.     Comments: AAOx3, answering questions and following commands appropriately; equal strength UE and LE  bilaterally; CN grossly intact; moves all extremities appropriately without ataxia; no focal neuro deficits or facial asymmetry appreciated     ED Results / Procedures / Treatments   Labs (all labs ordered are listed, but only abnormal results are displayed) Labs Reviewed  CBC - Abnormal; Notable for the following components:      Result Value   WBC 13.4 (*)    All other components within normal limits  URINALYSIS, ROUTINE W REFLEX MICROSCOPIC - Abnormal; Notable for the following components:   APPearance CLOUDY (*)    Hgb urine dipstick MODERATE (*)    Leukocytes,Ua MODERATE (*)    Bacteria, UA FEW (*)    All other components within normal limits  BASIC METABOLIC PANEL  CBG MONITORING, ED  I-STAT BETA HCG BLOOD, ED ([facility], [facility], [facility] ONLY)    EKG None  Radiology No results found.  Procedures Procedures    Medications Ordered in ED Medications  ondansetron (ZOFRAN-ODT) disintegrating tablet 4 mg (4 mg Oral Given 02/05/23 0026)    ED Course/ Medical Decision Making/ A&P                             Medical Decision Making Amount and/or Complexity of Data Reviewed Labs: ordered. ECG/medicine tests: ordered and independent interpretation performed.  Risk Prescription drug management.   27 year old female presenting to the ED with lightheadedness, dizziness, nausea, and vomiting since starting propranolol and Lexapro 2 days ago as prescribed by her psychiatrist.  Her vitals are stable on room air.  EKG is nonischemic.  Labs were sent and are overall reassuring.  She is not necessarily orthostatic with standing but her blood pressure is soft in the low 100s.  She does report some lightheadedness with standing.  Reports at baseline her blood pressure and heart rate are already borderline low.  I suspect the propranolol may be exacerbating this.  Nausea has resolved after Zofran here in the ED.  She is hesitant to continue her medications, this is likely reasonable until she speaks  with her psychiatrist.  She will contact him in the morning for further direction about her medications.  She otherwise appears stable for discharge.  Work note provided.  She was encouraged to return here for any new or acute changes.  Final Clinical Impression(s) / ED Diagnoses Final diagnoses:  Lightheadedness  Medication side effect    Rx / DC Orders ED Discharge Orders     None         Garlon Hatchet, PA-C 02/05/23 0152    Marily Memos, MD 02/05/23 [phone]

## 2023-02-10 ENCOUNTER — Ambulatory Visit (HOSPITAL_COMMUNITY)
Admission: EM | Admit: 2023-02-10 | Discharge: 2023-02-10 | Disposition: A | Discharge status: Home / Self Care | Payer: 59 | Attending: Family Medicine | Admitting: Family Medicine

## 2023-02-10 ENCOUNTER — Telehealth (HOSPITAL_COMMUNITY): Payer: Self-pay | Admitting: *Deleted

## 2023-02-10 ENCOUNTER — Encounter (HOSPITAL_COMMUNITY): Payer: Self-pay | Admitting: Emergency Medicine

## 2023-02-10 DIAGNOSIS — F419 Anxiety disorder, unspecified: Secondary | ICD-10-CM | POA: Diagnosis not present

## 2023-02-10 MED ORDER — HYDROXYZINE HCL 10 MG PO TABS: 10.0000 mg | ORAL_TABLET | Freq: Every evening | ORAL | 0 refills | Status: DC | PRN

## 2023-02-10 NOTE — Telephone Encounter (Signed)
Ok. Regina Miller  is working to get her in as son as possible.

## 2023-02-10 NOTE — ED Triage Notes (Signed)
Pt was started on propranolol on 4/16 and was in hospital on 4/17 due to dropping HR and BP.  Pt reports that her anxiety and depression are really bad and unable to go out in public or to work.  Reports that went to [facility] and was referred to the provider her prescribed medications. Called office and was told they would try to get her in ASAP to see someone.  Reports chest pain when panics. Waking up out of sleep with heart racing.

## 2023-02-10 NOTE — Discharge Instructions (Signed)
Take hydroxyzine 10 mg--1 tablet at bedtime as needed for anxiety  Please follow-up with your behavioral health

## 2023-02-10 NOTE — Telephone Encounter (Signed)
Pt called stating hat she is currently @ [facility] due to s/e from the Inderal and Lexapro. Pt was taking meds as ordered: Propranolol 10 mg TID since 02/05/23.Marland Kitchen as well as Lexapro 5 mg QD. Pt states that she has had presyncopal episodes and hypotension while on the these meds. Pt has d/c both meds recently. She also says that she has been waking up during the night with panic attacks, even on the medication she says. Pt also says that she' so anxious that she can't really eat due to n/v that she says is r/t anxiety or be around people without panicking. Pt is currently scheduled for Mychart visit on 03/05/23. Last visit 02/03/23. Pt asked for a work excuse as well. Please review and advise.

## 2023-02-10 NOTE — ED Provider Notes (Signed)
[facility]    CSN: [id number] Arrival date & time: 02/10/23  1153      History   Chief Complaint Chief Complaint  Patient presents with   Anxiety   Panic Attack    HPI Regina Miller is a 27 y.o. female.    Anxiety   Here for anxiety and heart palpitations and panic.  For the most part the panic and palpitations are happening in the evening and keeping her from sleeping.  She had been placed on propranolol and Lexapro.  The propranolol dropped her blood pressure and heart rate, so that has been stopped.  Unfortunately her Lexapro has also been discontinued.  She is established with behavioral health and has a message into them for alternative treatment.   She states she cannot possibly go to work  She states she does not have suicidal thoughts today Past Medical History:  Diagnosis Date   Acid reflux    Anxiety    Asthma    Attention deficit disorder    Heart murmur    Reports she had a heart murmur as a baby that has been evaluated. She is not currently followed by a cardiologist.    Patient Active Problem List   Diagnosis Date Noted   PTSD (post-traumatic stress disorder) 02/03/2023   Borderline personality disorder 02/03/2023   Morbid obesity 07/19/2019   First trimester pregnancy 07/26/2017   Anxiety 12/22/2013   Eczema 12/22/2013   Body mass index, pediatric, greater than or 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 age 39/02/2014   Acne 04/26/2012   Insomnia 07/06/2011   GERD 09/19/2008   ATTENTION DEFICIT, W/HYPERACTIVITY 12/17/2006    Past Surgical History:  Procedure Laterality Date   TONSILLECTOMY     TYMPANOSTOMY TUBE PLACEMENT Bilateral     OB History     Gravida  1   Para      Term      Preterm      AB      Living         SAB      IAB      Ectopic      Multiple      Live Births               Home Medications    Prior to Admission medications   Medication Sig Start Date End Date Taking? Authorizing  Provider  hydrOXYzine (ATARAX) 10 MG tablet Take 1 tablet (10 mg total) by mouth at bedtime as needed for anxiety. 02/10/23  Yes Zenia Resides, MD  diphenhydrAMINE (BENADRYL) 25 mg capsule Take 1 capsule (25 mg total) by mouth every 8 (eight) hours as needed for allergies. 07/19/19 10/18/19  Betancourt, Jarold Song, NP  loratadine (CLARITIN) 10 MG tablet Take 1 tablet (10 mg total) by mouth daily. 07/19/19 10/18/19  Betancourt, Jarold Song, NP    Family History Family History  Problem Relation Age of Onset   Psoriasis Mother    Hypertension Father    Eczema Father    Diabetes Father    Heart disease Paternal Grandmother    Cancer Maternal Grandmother     Social History Social History   Tobacco Use   Smoking status: Every Day    Packs/day: 1    Types: Cigarettes   Smokeless tobacco: Never   Tobacco comments:    Parents smoke around her  Vaping Use   Vaping Use: Never used  Substance Use Topics   Alcohol use: Not Currently  Comment: occ   Drug use: Not Currently    Types: Marijuana     Allergies   Kiwi extract and Other   Review of Systems Review of Systems   Physical Exam Triage Vital Signs ED Triage Vitals  Enc Vitals Group     BP 02/10/23 1326 105/70     Pulse Rate 02/10/23 1326 79     Resp 02/10/23 1326 17     Temp 02/10/23 1326 98.9 F (37.2 C)     Temp Source 02/10/23 1326 Oral     SpO2 02/10/23 1326 99 %     Weight --      Height --      Head Circumference --      Peak Flow --      Pain Score 02/10/23 1323 0     Pain Loc --      Pain Edu? --      Excl. in GC? --    No data found.  Updated Vital Signs BP 105/70 (BP Location: Right Arm)   Pulse 79   Temp 98.9 F (37.2 C) (Oral)   Resp 17   LMP 01/19/2023   SpO2 99%   Visual Acuity Right Eye Distance:   Left Eye Distance:   Bilateral Distance:    Right Eye Near:   Left Eye Near:    Bilateral Near:     Physical Exam Vitals reviewed.  Constitutional:      General: She is not in acute  distress.    Appearance: She is not ill-appearing, toxic-appearing or diaphoretic.  HENT:     Mouth/Throat:     Mouth: Mucous membranes are moist.  Eyes:     Extraocular Movements: Extraocular movements intact.     Conjunctiva/sclera: Conjunctivae normal.     Pupils: Pupils are equal, round, and reactive to light.  Cardiovascular:     Rate and Rhythm: Normal rate and regular rhythm.     Heart sounds: No murmur heard. Pulmonary:     Effort: Pulmonary effort is normal.     Breath sounds: Normal breath sounds.  Musculoskeletal:     Cervical back: Neck supple.  Lymphadenopathy:     Cervical: No cervical adenopathy.  Skin:    Capillary Refill: Capillary refill takes less than 2 seconds.     Coloration: Skin is not jaundiced or pale.  Neurological:     General: No focal deficit present.     Mental Status: She is alert and oriented to person, place, and time.  Psychiatric:        Behavior: Behavior normal.      UC Treatments / Results  Labs (all labs ordered are listed, but only abnormal results are displayed) Labs Reviewed - No data to display  EKG   Radiology No results found.  Procedures Procedures (including critical care time)  Medications Ordered in UC Medications - No data to display  Initial Impression / Assessment and Plan / UC Course  I have reviewed the triage vital signs and the nursing notes.  Pertinent labs & imaging results that were available during my care of the patient were reviewed by me and considered in my medical decision making (see chart for details).        Heart rate and blood pressure are normal here.  EKGs from April 17 and 18 reviewed and showed normal sinus rhythm.  Encouragement is given for her to follow-up with her behavioral health provider.  5-day supply of 10 mg hydroxyzine is sent in  for her to take every evening. Final Clinical Impressions(s) / UC Diagnoses   Final diagnoses:  Anxiety     Discharge Instructions       Take hydroxyzine 10 mg--1 tablet at bedtime as needed for anxiety  Please follow-up with your behavioral health     ED Prescriptions     Medication Sig Dispense Auth. Provider   hydrOXYzine (ATARAX) 10 MG tablet Take 1 tablet (10 mg total) by mouth at bedtime as needed for anxiety. 5 tablet Gertha Lichtenberg, Janace Aris, MD      PDMP not reviewed this encounter.   Zenia Resides, MD 02/10/23 1409

## 2023-02-12 ENCOUNTER — Telehealth (HOSPITAL_BASED_OUTPATIENT_CLINIC_OR_DEPARTMENT_OTHER): Payer: 59 | Admitting: Psychiatry

## 2023-02-12 ENCOUNTER — Encounter (HOSPITAL_COMMUNITY): Payer: Self-pay | Admitting: Psychiatry

## 2023-02-12 DIAGNOSIS — F411 Generalized anxiety disorder: Secondary | ICD-10-CM | POA: Insufficient documentation

## 2023-02-12 DIAGNOSIS — F4310 Post-traumatic stress disorder, unspecified: Secondary | ICD-10-CM

## 2023-02-12 DIAGNOSIS — F603 Borderline personality disorder: Secondary | ICD-10-CM | POA: Diagnosis not present

## 2023-02-12 MED ORDER — HYDROXYZINE HCL 25 MG PO TABS: 25.0000 mg | ORAL_TABLET | Freq: Every evening | ORAL | 1 refills | Status: DC | PRN

## 2023-02-12 NOTE — Progress Notes (Signed)
BH MD/PA/NP OP Progress Note  02/12/2023 9:59 AM Regina Miller  MRN:  [id number]  Visit Diagnosis:    ICD-10-CM   1. Borderline personality disorder  F60.3     2. PTSD (post-traumatic stress disorder)  F43.10     3. GAD (generalized anxiety disorder)  F41.1 hydrOXYzine (ATARAX) 25 MG tablet      Assessment: Regina Miller is a 27 y.o. female with a history of anxiety and reported ADHD who presented to [facility] at [facility] for initial evaluation on 02/03/2023.  At initial evaluation patient reported symptoms of mood lability ranging from euthymic to depressed, tearful, and passively suicidal.  Patient is incongruent in her responses saying that she would never try to kill herself, but also that she made a suicide attempt a few days before initial evaluation.  Patient however had not considered that a suicide attempt as she was more curious about how strangling herself and feeling.  We discussed safety planning and crisis resources with the patient.  On review she did endorse a history of significant past trauma and experiences nightmares/flashbacks, emotional numbness, hypervigilance, increased startle response, and poor sleep.  She was also screened for borderline personality disorder and endorsed symptoms of poor sense of self, real or perceived feelings of abandonment, unstable interpersonal relationships, splitting, impulsive behaviors (binging, excessive spending, and promiscuity), NSSI (tattoos and piercings), mood lability often related to interpersonal stressors, chronic feelings of emptiness, and dissociative symptoms (which have decreased as she has gotten older).   At follow-up evaluation patient had endorsed increased anxiety symptoms including excessive worry, difficulty controlling the worrying, difficulty relaxing, increased restlessness, increased irritability, and fears something awful happening.  Anxiety symptoms were worse in social situations.  Patient met criteria for PTSD, GAD, and borderline personality disorder.    Regina Miller presents for follow-up evaluation. Today, 02/12/23, patient reports increased anxiety over the past week and a half.  She did take the Lexapro and propranolol for 1 day after the appointment however became lightheaded and dizzy after doing so.  Patient presented to the ED where she has not been having orthostatic but did have low blood pressure in the 100s.  Since then the patient discontinued the Lexapro and propranolol.  She had also presented to urgent care on 4/23 due to increased anxiety and was prescribed hydroxyzine.  We reviewed the adverse effects propranolol and recommended discontinuing the medication.  As for Lexapro we explained that it was unlikely to cause the symptoms she experienced and we did review the potential adverse effects from his medication.  We will restart Lexapro 5 mg daily and increase Atarax to 25 mg 3 times a day as needed.  Reviewed the potential adverse effects of increasing Atarax.  Plan: - Start Lexapro 5 mg QD - Increase Atarax to 25 mg TID prn anxiety - Discontinue Propanolol 10 mg TID prn for anxiety - Neuropsych testing referral - Crisis resources reviewed - LOA letter provided from 4/17 to 5/6 - Follow up in a month   Chief Complaint:  Chief Complaint  Patient presents with   Follow-up   HPI: Patient presented to the emergency room and urgent care twice in the interim at first secondary to lightheadedness which was likely secondary to the propranolol and occurred a day after her initial appointment.  Second presentation was a week later due to increased symptoms of anxiety.  Of note following patient's first presentation to the ED she had discontinued both the propranolol and Lexapro.  At her second presentation she had been prescribed Atarax 10 mg.  On presentation today patient reports that she is struggling with increased anxiety and panic symptoms most  notably when she is in public or social settings.  She has not been able to go to work in the past week secondary to this.  Patient describes symptoms of excessive worry, shortness of breath, racing heart, and palpitations.  She denies any suicidal thoughts at this time or during the past week.  We reviewed the medications and she notes that she discontinued them both after her ED presentation on 4/17 that she was not sure which 1 caused her to feel lightheaded and dizzy.  We explained that this was likely secondary to the propranolol and again reviewed the potential side effects we had mentioned when starting medication.  On review of the ED note from 4/17 it was noted that while patient did not display orthostatic vitals she did have a low blood pressure.  The hydroxyzine that was prescribed on 4/23 patient has used a couple times and found it to be helpful for anxiety without being over sedating.  We suggested restarting the Lexapro and continuing the hydroxyzine at a higher dose to help manage acute anxiety symptoms.  Risks and benefits of both these medications were discussed and patient was in agreement with this plan.  Patient had also requested a letter from work as she he has not been able to attend for the past week.  We reviewed her symptoms and the impact they have work which patient notices been difficult due to the number of people who work there.  We agreed to write a letter of absence recommended to excuse her from work from April 17 until May 6.  Past Psychiatric History: Patient denies any past psychiatric hospitalizations. She notes a suicide attempt in April 2024 where she tried to strangle herself.  Though on looking back she reports that not to say being suicidal in nature and instead is curious on how it would feel.  She denies experiencing any symptoms of lightheadedness or passing out upon doing this.  Adderall and Seroquel in the past.  She denies alcohol or other substance  use  Past Medical History:  Past Medical History:  Diagnosis Date   Acid reflux    Anxiety    Asthma    Attention deficit disorder    Heart murmur    Reports she had a heart murmur as a baby that has been evaluated. She is not currently followed by a cardiologist.    Past Surgical History:  Procedure Laterality Date   TONSILLECTOMY     TYMPANOSTOMY TUBE PLACEMENT Bilateral     Family History:  Family History  Problem Relation Age of Onset   Psoriasis Mother    Hypertension Father    Eczema Father    Diabetes Father    Heart disease Paternal Grandmother    Cancer Maternal Grandmother     Social History:  Social History   Socioeconomic History   Marital status: Single    Spouse name: Not on file   Number of children: Not on file   Years of education: Not on file   Highest education level: Not on file  Occupational History   Not on file  Tobacco Use   Smoking status: Every Day    Packs/day: 1    Types: Cigarettes   Smokeless tobacco: Never   Tobacco comments:    Parents smoke around her  Vaping Use  Vaping Use: Never used  Substance and Sexual Activity   Alcohol use: Not Currently    Comment: occ   Drug use: Not Currently    Types: Marijuana   Sexual activity: Yes    Birth control/protection: None  Other Topics Concern   Not on file  Social History Narrative   Lives with father and step-mom. Mother lives in [place]. Has multiple siblings through dad but none who live at home with her as they are all adults.   Social Determinants of Health   Financial Resource Strain: Not on file  Food Insecurity: Not on file  Transportation Needs: Not on file  Physical Activity: Not on file  Stress: Not on file  Social Connections: Not on file    Allergies:  Allergies  Allergen Reactions   Kiwi Extract Nausea And Vomiting   Other Hives    Nair hair removal    Current Medications: Current Outpatient Medications  Medication Sig Dispense Refill    hydrOXYzine (ATARAX) 25 MG tablet Take 1 tablet (25 mg total) by mouth at bedtime as needed for anxiety. 90 tablet 1   No current facility-administered medications for this visit.     Psychiatric Specialty Exam: Review of Systems  Last menstrual period 01/19/2023.There is no height or weight on file to calculate BMI.  General Appearance: Fairly Groomed  Eye Contact:  Fair  Speech:  Clear and Coherent and Normal Rate  Volume:  Normal  Mood:  Anxious  Affect:  Congruent  Thought Process:  Coherent  Orientation:  Full (Time, Place, and Person)  Thought Content: Logical, Illogical, and Rumination   Suicidal Thoughts:  No  Homicidal Thoughts:  No  Memory:  Immediate;   Fair  Judgement:  Impaired  Insight:  Lacking  Psychomotor Activity:  Normal  Concentration:  Concentration: Fair  Recall:  Fair  Fund of Knowledge: Poor  Language: Good  Akathisia:  NA    AIMS (if indicated): not done  Assets:  Communication Skills Desire for Improvement Housing Transportation  ADL's:  Intact  Cognition: WNL  Sleep:  Fair   Metabolic Disorder Labs: Lab Results  Component Value Date   HGBA1C 5.6 04/26/2014   MPG 114 04/26/2014   No results found for: "PROLACTIN" Lab Results  Component Value Date   CHOL 137 04/26/2014   TRIG 72 04/26/2014   HDL 48 04/26/2014   CHOLHDL 2.9 04/26/2014   VLDL 14 04/26/2014   LDLCALC 75 04/26/2014   No results found for: "TSH"  Therapeutic Level Labs: No results found for: "LITHIUM" No results found for: "VALPROATE" No results found for: "CBMZ"   Screenings: GAD-7    Flowsheet Row Office Visit [facility]/03/2023 in [facility]  Total GAD-7 Score 19      PHQ2-9    Flowsheet Row Office Visit from 02/03/2023 in [facility] Office Visit from 12/22/2013 in [facility]  PHQ-2 Total Score 6 0  PHQ-9 Total Score 22 6       Flowsheet Row ED from 02/10/2023 in [facility] ED from 02/04/2023 in [facility] Office Visit from 02/03/2023 in [facility]  C-SSRS RISK CATEGORY No Risk No Risk High Risk       Collaboration of Care: Collaboration of Care: Medication Management AEB medication prescription and Other provider involved in patient's care AEB urgent care and ED  chart review  Patient/Guardian was advised Release of Information must be obtained prior to any record release in order to collaborate their care with an outside provider. Patient/Guardian was advised if they have not already done so to contact the registration department to sign all necessary forms in order for [place] to release information regarding their care.   Consent: Patient/Guardian gives verbal consent for treatment and assignment of benefits for services provided during this visit. Patient/Guardian expressed understanding and agreed to proceed.    Stasia Cavalier, MD 02/12/2023, 9:59 AM   Virtual Visit via Video Note  I connected with Regina Miller on 02/12/23 at  9:00 AM EDT by a video enabled telemedicine application and verified that I am speaking with the correct person using two identifiers.  Location: Patient: In the car Provider: Home Office   I discussed the limitations of evaluation and management by telemedicine and the availability of in person appointments. The patient expressed understanding and agreed to proceed.   I discussed the assessment and treatment plan with the patient. The patient was provided an opportunity to ask questions and all were answered. The patient agreed with the plan and demonstrated an understanding of the instructions.   The patient was advised to call back or seek an in-person evaluation if the symptoms worsen or if the condition fails to improve as anticipated.  I provided 15 minutes of  non-face-to-face time during this encounter.   Stasia Cavalier, MD

## 2023-02-16 ENCOUNTER — Telehealth (HOSPITAL_COMMUNITY): Payer: Self-pay | Admitting: *Deleted

## 2023-02-16 NOTE — Telephone Encounter (Signed)
Pt called requesting our fax # so her work my contact us if needed. Writer reminded pt that work excuse is at the office as she has said she would pick it up. Pt says she will pick up the letter, and a copy, today from this office.

## 2023-02-26 ENCOUNTER — Telehealth (HOSPITAL_COMMUNITY): Payer: Self-pay | Admitting: *Deleted

## 2023-02-26 NOTE — Telephone Encounter (Signed)
Writer spoke with pt who called to inform that [employer] contacted her to say they did not receive FMLA/RTW documentation that writer faxed on 02/20/23. Document faxed again to [phone]. Transmission confirmation status "ok". Pt also wanted to request that her RTW date be extended to after 03/03/23 as she is experiencing extreme social anxiety. Pt has an appointment on 03/05/23 for f/u. Please review and advise.

## 2023-02-26 NOTE — Telephone Encounter (Signed)
Ok I will let her know.       Thanks

## 2023-03-02 ENCOUNTER — Encounter (HOSPITAL_COMMUNITY): Payer: Self-pay

## 2023-03-02 ENCOUNTER — Encounter (HOSPITAL_COMMUNITY): Payer: 59 | Admitting: Psychiatry

## 2023-03-02 NOTE — Progress Notes (Signed)
This encounter was created in error - please disregard.

## 2023-03-02 NOTE — Progress Notes (Deleted)
BH MD/PA/NP OP Progress Note  03/02/2023 9:23 AM Regina Miller  MRN:  [id number]  Visit Diagnosis:    ICD-10-CM   1. Borderline personality disorder (HCC)  F60.3     2. PTSD (post-traumatic stress disorder)  F43.10     3. GAD (generalized anxiety disorder)  F41.1       Assessment: Regina Miller is a 27 y.o. female with a history of anxiety and reported ADHD who presented to [facility] at [facility] for initial evaluation on 02/03/2023.  At initial evaluation patient reported symptoms of mood lability ranging from euthymic to depressed, tearful, and passively suicidal.  Patient is incongruent in her responses saying that she would never try to kill herself, but also that she made a suicide attempt a few days before initial evaluation.  Patient however had not considered that a suicide attempt as she was more curious about how strangling herself and feeling.  We discussed safety planning and crisis resources with the patient.  On review she did endorse a history of significant past trauma and experiences nightmares/flashbacks, emotional numbness, hypervigilance, increased startle response, and poor sleep.  She was also screened for borderline personality disorder and endorsed symptoms of poor sense of self, real or perceived feelings of abandonment, unstable interpersonal relationships, splitting, impulsive behaviors (binging, excessive spending, and promiscuity), NSSI (tattoos and piercings), mood lability often related to interpersonal stressors, chronic feelings of emptiness, and dissociative symptoms (which have decreased as she has gotten older).   At follow-up evaluation patient had endorsed increased anxiety symptoms including excessive worry, difficulty controlling the worrying, difficulty relaxing, increased restlessness, increased irritability, and fears something awful happening.  Anxiety symptoms were worse in social situations. Patient met criteria for PTSD,  GAD, and borderline personality disorder.    [name] presents for follow-up evaluation. Today, 03/02/23, patient reports    increased anxiety over the past week and a half.  She did take the Lexapro and propranolol for 1 day after the appointment however became lightheaded and dizzy after doing so.  Patient presented to the ED where she has not been having orthostatic but did have low blood pressure in the 100s.  Since then the patient discontinued the Lexapro and propranolol.  She had also presented to urgent care on 4/23 due to increased anxiety and was prescribed hydroxyzine.  We reviewed the adverse effects propranolol and recommended discontinuing the medication.  As for Lexapro we explained that it was unlikely to cause the symptoms she experienced and we did review the potential adverse effects from his medication.  We will restart Lexapro 5 mg daily and increase Atarax to 25 mg 3 times a day as needed.  Reviewed the potential adverse effects of increasing Atarax.  Plan: - Start Lexapro 5 mg QD - Increase Atarax to 25 mg TID prn anxiety - Discontinue Propanolol 10 mg TID prn for anxiety - Neuropsych testing referral - Crisis resources reviewed - LOA letter provided from 4/17 to 5/6 - Follow up in a month   Chief Complaint:  No chief complaint on file.  HPI: On presentation today patient reports    that she is struggling with increased anxiety and panic symptoms most notably when she is in public or social settings.  She has not been able to go to work in the past week secondary to this.  Patient describes symptoms of excessive worry, shortness of breath, racing heart, and palpitations.  She denies any suicidal thoughts at this time or during  the past week.  We reviewed the medications and she notes that she discontinued them both after her ED presentation on 4/17 that she was not sure which 1 caused her to feel lightheaded and dizzy.  We explained that this was likely  secondary to the propranolol and again reviewed the potential side effects we had mentioned when starting medication.  On review of the ED note from 4/17 it was noted that while patient did not display orthostatic vitals she did have a low blood pressure.  The hydroxyzine that was prescribed on 4/23 patient has used a couple times and found it to be helpful for anxiety without being over sedating.  We suggested restarting the Lexapro and continuing the hydroxyzine at a higher dose to help manage acute anxiety symptoms.  Risks and benefits of both these medications were discussed and patient was in agreement with this plan.  Patient had also requested a letter from work as she he has not been able to attend for the past week.  We reviewed her symptoms and the impact they have work which patient notices been difficult due to the number of people who work there.  We agreed to write a letter of absence recommended to excuse her from work from April 17 until May 6.  Past Psychiatric History: Patient denies any past psychiatric hospitalizations. She notes a suicide attempt in April 2024 where she tried to strangle herself.  Though on looking back she reports that not to say being suicidal in nature and instead is curious on how it would feel.  She denies experiencing any symptoms of lightheadedness or passing out upon doing this.  Adderall and Seroquel in the past.  She denies alcohol or other substance use  Past Medical History:  Past Medical History:  Diagnosis Date   Acid reflux    Anxiety    Asthma    Attention deficit disorder    Heart murmur    Reports she had a heart murmur as a baby that has been evaluated. She is not currently followed by a cardiologist.    Past Surgical History:  Procedure Laterality Date   TONSILLECTOMY     TYMPANOSTOMY TUBE PLACEMENT Bilateral     Family History:  Family History  Problem Relation Age of Onset   Psoriasis Mother    Hypertension Father    Eczema  Father    Diabetes Father    Heart disease Paternal Grandmother    Cancer Maternal Grandmother     Social History:  Social History   Socioeconomic History   Marital status: Single    Spouse name: Not on file   Number of children: Not on file   Years of education: Not on file   Highest education level: Not on file  Occupational History   Not on file  Tobacco Use   Smoking status: Every Day    Packs/day: 1    Types: Cigarettes   Smokeless tobacco: Never   Tobacco comments:    Parents smoke around her  Vaping Use   Vaping Use: Never used  Substance and Sexual Activity   Alcohol use: Not Currently    Comment: occ   Drug use: Not Currently    Types: Marijuana   Sexual activity: Yes    Birth control/protection: None  Other Topics Concern   Not on file  Social History Narrative   Lives with father and step-mom. Mother lives in [place]. Has multiple siblings through dad but none who live at home with  her as they are all adults.   Social Determinants of Health   Financial Resource Strain: Not on file  Food Insecurity: Not on file  Transportation Needs: Not on file  Physical Activity: Not on file  Stress: Not on file  Social Connections: Not on file    Allergies:  Allergies  Allergen Reactions   Kiwi Extract Nausea And Vomiting   Other Hives    Nair hair removal    Current Medications: Current Outpatient Medications  Medication Sig Dispense Refill   hydrOXYzine (ATARAX) 25 MG tablet Take 1 tablet (25 mg total) by mouth at bedtime as needed for anxiety. 90 tablet 1   No current facility-administered medications for this visit.     Psychiatric Specialty Exam: Review of Systems  There were no vitals taken for this visit.There is no height or weight on file to calculate BMI.  General Appearance: Fairly Groomed  Eye Contact:  Fair  Speech:  Clear and Coherent and Normal Rate  Volume:  Normal  Mood:  Anxious  Affect:  Congruent  Thought Process:  Coherent   Orientation:  Full (Time, Place, and Person)  Thought Content: Logical, Illogical, and Rumination   Suicidal Thoughts:  No  Homicidal Thoughts:  No  Memory:  Immediate;   Fair  Judgement:  Impaired  Insight:  Lacking  Psychomotor Activity:  Normal  Concentration:  Concentration: Fair  Recall:  Fair  Fund of Knowledge: Poor  Language: Good  Akathisia:  NA    AIMS (if indicated): not done  Assets:  Communication Skills Desire for Improvement Housing Transportation  ADL's:  Intact  Cognition: WNL  Sleep:  Fair   Metabolic Disorder Labs: Lab Results  Component Value Date   HGBA1C 5.6 04/26/2014   MPG 114 04/26/2014   No results found for: "PROLACTIN" Lab Results  Component Value Date   CHOL 137 04/26/2014   TRIG 72 04/26/2014   HDL 48 04/26/2014   CHOLHDL 2.9 04/26/2014   VLDL 14 04/26/2014   LDLCALC 75 04/26/2014   No results found for: "TSH"  Therapeutic Level Labs: No results found for: "LITHIUM" No results found for: "VALPROATE" No results found for: "CBMZ"   Screenings: GAD-7    Flowsheet Row [facility]  Total GAD-7 Score 19      PHQ2-9    Flowsheet Row Office Visit from 02/03/2023 in [facility] Office Visit from 12/22/2013 in [facility]  PHQ-2 Total Score 6 0  PHQ-9 Total Score 22 6      Flowsheet Row ED from 02/10/2023 in [facility] ED from 02/04/2023 in [facility] Emergency Department at [facility] Office Visit from 02/03/2023 in [facility]  C-SSRS RISK CATEGORY No Risk No Risk High Risk       Collaboration of Care: Collaboration of Care: Medication Management AEB medication prescription and Other provider involved in patient's care AEB urgent care and ED chart review  Patient/Guardian was advised  Release of Information must be obtained prior to any record release in order to collaborate their care with an outside provider. Patient/Guardian was advised if they have not already done so to contact the registration department to sign all necessary forms in order for [place] to release information regarding their care.   Consent: Patient/Guardian gives verbal consent for treatment and assignment of benefits for services provided during  this visit. Patient/Guardian expressed understanding and agreed to proceed.    Stasia Cavalier, MD 03/02/2023, 9:23 AM   Virtual Visit via Video Note  I connected with Marlaine Hind on 03/02/23 at  1:30 PM EDT by a video enabled telemedicine application and verified that I am speaking with the correct person using two identifiers.  Location: Patient: In the car Provider: Home Office   I discussed the limitations of evaluation and management by telemedicine and the availability of in person appointments. The patient expressed understanding and agreed to proceed.   I discussed the assessment and treatment plan with the patient. The patient was provided an opportunity to ask questions and all were answered. The patient agreed with the plan and demonstrated an understanding of the instructions.   The patient was advised to call back or seek an in-person evaluation if the symptoms worsen or if the condition fails to improve as anticipated.  I provided 15 minutes of non-face-to-face time during this encounter.   Stasia Cavalier, MD

## 2023-03-05 ENCOUNTER — Encounter (HOSPITAL_COMMUNITY): Payer: Self-pay

## 2023-03-05 ENCOUNTER — Encounter (HOSPITAL_COMMUNITY): Payer: 59 | Admitting: Psychiatry

## 2023-03-05 NOTE — Progress Notes (Signed)
This encounter was created in error - please disregard.

## 2023-03-06 ENCOUNTER — Encounter (HOSPITAL_COMMUNITY): Payer: Self-pay | Admitting: Psychiatry

## 2023-03-06 ENCOUNTER — Telehealth (HOSPITAL_COMMUNITY): Payer: Self-pay | Admitting: Psychiatry

## 2023-03-06 ENCOUNTER — Telehealth (HOSPITAL_BASED_OUTPATIENT_CLINIC_OR_DEPARTMENT_OTHER): Payer: 59 | Admitting: Psychiatry

## 2023-03-06 DIAGNOSIS — F603 Borderline personality disorder: Secondary | ICD-10-CM

## 2023-03-06 DIAGNOSIS — F411 Generalized anxiety disorder: Secondary | ICD-10-CM

## 2023-03-06 DIAGNOSIS — F4310 Post-traumatic stress disorder, unspecified: Secondary | ICD-10-CM | POA: Diagnosis not present

## 2023-03-06 MED ORDER — ESCITALOPRAM OXALATE 10 MG PO TABS: 10.0000 mg | ORAL_TABLET | Freq: Every day | ORAL | 2 refills | Status: DC

## 2023-03-06 MED ORDER — ARIPIPRAZOLE 2 MG PO TABS: 2.0000 mg | ORAL_TABLET | Freq: Every day | ORAL | 2 refills | Status: DC

## 2023-03-06 NOTE — Telephone Encounter (Signed)
D:  Dr. Mercy Riding referred pt to virtual MH-IOP.  A:  Placed call and oriented pt.  Pt states she will need her short term disability extended from 02-23-23 up to the length of stay of MH-IOP.  Informed pt that she wasn't under MH-IOP services so that provider can't back date; short term disability would be picked up whenever she starts MH-IOP.  Scheduled a CCA with pt on 03-10-23 @ 9:30 a.m; and informed pt she could start group on 03-11-23.  Pt states she needs sooner and needs someone to back date her paperwork to 02-23-23.  "Your program is too expensive, I can't afford that."  Discussed [facility] with pt.  Provided pt with [facility]'s phone number so she could see if they would back date for her.  Also, mentioned she could call [facility]. Pt states she feels unheard by everyone.  Case manager will still reach out to pt on 03-10-23 to see if she wants to move forward with the CCA and starting on 03-11-23.  Inform Dr. Mercy Riding.

## 2023-03-06 NOTE — Progress Notes (Signed)
BH MD/PA/NP OP Progress Note  03/06/2023 11:20 AM Regina Miller  MRN:  [id number]  Visit Diagnosis:    ICD-10-CM   1. Borderline personality disorder (HCC)  F60.3 escitalopram (LEXAPRO) 10 MG tablet    ARIPiprazole (ABILIFY) 2 MG tablet    2. PTSD (post-traumatic stress disorder)  F43.10 escitalopram (LEXAPRO) 10 MG tablet    ARIPiprazole (ABILIFY) 2 MG tablet    3. GAD (generalized anxiety disorder)  F41.1 escitalopram (LEXAPRO) 10 MG tablet    ARIPiprazole (ABILIFY) 2 MG tablet      Assessment: Regina Miller is a 27 y.o. female with a history of anxiety and reported ADHD who presented to [facility] at [facility] for initial evaluation on 02/03/2023.  At initial evaluation patient reported symptoms of mood lability ranging from euthymic to depressed, tearful, and passively suicidal.  Patient is incongruent in her responses saying that she would never try to kill herself, but also that she made a suicide attempt a few days before initial evaluation.  Patient however had not considered that a suicide attempt as she was more curious about how strangling herself and feeling.  We discussed safety planning and crisis resources with the patient.  On review she did endorse a history of significant past trauma and experiences nightmares/flashbacks, emotional numbness, hypervigilance, increased startle response, and poor sleep.  She was also screened for borderline personality disorder and endorsed symptoms of poor sense of self, real or perceived feelings of abandonment, unstable interpersonal relationships, splitting, impulsive behaviors (binging, excessive spending, and promiscuity), NSSI (tattoos and piercings), mood lability often related to interpersonal stressors, chronic feelings of emptiness, and dissociative symptoms (which have decreased as she has gotten older).   At follow-up evaluation patient had endorsed increased anxiety symptoms including excessive worry,  difficulty controlling the worrying, difficulty relaxing, increased restlessness, increased irritability, and fears something awful happening.  Anxiety symptoms were worse in social situations. Patient met criteria for PTSD, GAD, and borderline personality disorder.    Regina Miller presents for follow-up evaluation. Today, 03/06/23, patient reports slight improvement in anxiety and depressive symptoms, though still can experience passive SI. At those times she will reach out to family for support who will come and sit with her. Patient also endorses experienced an increase/change in her dissociative (derealization) symptoms over the past month. While we can not rule out a psychotic disorder at this time, it appears less likely due to the lack of negative symptoms, patient remaining organized, and lack of family history. We will titrate Lexapro to 10 mg QD and start Abilify 2 mg QD. Risks and benefits were reviewed. Patient was also referred for the IOP program.  Plan: - Increase Lexapro to 10 mg QD - Start Abilify 2 mg QD - Continue Atarax to 25 mg TID prn anxiety - Neuropsych testing referral - Crisis resources reviewed - LOA letter provided from 4/17 to 5/6, can consider extension if patient enrolled in IOP/PHP - IOP program referral - Follow up in a month   Chief Complaint:  Chief Complaint  Patient presents with   Follow-up   HPI: Patient presents report that she does not feel that she is better this past month. Regina Miller reports that she can have moments where she feels like she is dissociating now, which she had not experienced previously. Patient describes it as getting a feeling like the world or the people around her are fake. This has led her to isolate more at home, and she has not returned  to work. Regina Miller reports that the anxiety/depression have improved slightly, but she still does experience suicidal thoughts. When this occurs she will reach out to her family for support and  they will often come over which helps. While she finds this helpful she can experience dissociations while they are there, similarly along the lines that her family is not real. We reviewed these symptoms and explored concern for schizophrenia. Patient denied auditory or visual hallucinations or signs of disorganization. Patient is also unaware of any psychotic illnesses in her family. We explained at this time her symptoms seem to be more consistent with dissociations that can be seen in BPD as opposed to a psychotic illness. Based off her current symptoms and continued anxiety and depression it would be appropriate to titrate Lexapro and start Abilify. Risks and benefits were explained. It was also decided to start Abilify at low doses due to patient's sensitivity to medication in the past.   We also recommended patient attend a PHP or IOP program and provided a referral. Patient was open to consider one of these programs. She had also asked about FMLA and it was explained that extending it could be considered if she was enrolled in a higher level of care such as IOP.  Past Psychiatric History: Patient denies any past psychiatric hospitalizations. She notes a suicide attempt in April 2024 where she tried to strangle herself.  Though on looking back she reports that not to say being suicidal in nature and instead is curious on how it would feel.  She denies experiencing any symptoms of lightheadedness or passing out upon doing this.  Adderall, propranolol, and Seroquel in the past.  She denies alcohol or other substance use  Past Medical History:  Past Medical History:  Diagnosis Date   Acid reflux    Anxiety    Asthma    Attention deficit disorder    Heart murmur    Reports she had a heart murmur as a baby that has been evaluated. She is not currently followed by a cardiologist.    Past Surgical History:  Procedure Laterality Date   TONSILLECTOMY     TYMPANOSTOMY TUBE PLACEMENT Bilateral      Family History:  Family History  Problem Relation Age of Onset   Psoriasis Mother    Hypertension Father    Eczema Father    Diabetes Father    Heart disease Paternal Grandmother    Cancer Maternal Grandmother     Social History:  Social History   Socioeconomic History   Marital status: Single    Spouse name: Not on file   Number of children: Not on file   Years of education: Not on file   Highest education level: Not on file  Occupational History   Not on file  Tobacco Use   Smoking status: Every Day    Packs/day: 1    Types: Cigarettes   Smokeless tobacco: Never   Tobacco comments:    Parents smoke around her  Vaping Use   Vaping Use: Never used  Substance and Sexual Activity   Alcohol use: Not Currently    Comment: occ   Drug use: Not Currently    Types: Marijuana   Sexual activity: Yes    Birth control/protection: None  Other Topics Concern   Not on file  Social History Narrative   Lives with father and step-mom. Mother lives in [place]. Has multiple siblings through dad but none who live at home with her as they are  all adults.   Social Determinants of Health   Financial Resource Strain: Not on file  Food Insecurity: Not on file  Transportation Needs: Not on file  Physical Activity: Not on file  Stress: Not on file  Social Connections: Not on file    Allergies:  Allergies  Allergen Reactions   Kiwi Extract Nausea And Vomiting   Other Hives    Nair hair removal    Current Medications: Current Outpatient Medications  Medication Sig Dispense Refill   ARIPiprazole (ABILIFY) 2 MG tablet Take 1 tablet (2 mg total) by mouth daily. 30 tablet 2   escitalopram (LEXAPRO) 10 MG tablet Take 1 tablet (10 mg total) by mouth daily. 30 tablet 2   hydrOXYzine (ATARAX) 25 MG tablet Take 1 tablet (25 mg total) by mouth at bedtime as needed for anxiety. 90 tablet 1   No current facility-administered medications for this visit.     Psychiatric Specialty  Exam: Review of Systems  There were no vitals taken for this visit.There is no height or weight on file to calculate BMI.  General Appearance: Disheveled and Fairly Groomed  Eye Contact:  Minimal  Speech:  Clear and Coherent and Normal Rate  Volume:  Normal  Mood:  Anxious  Affect:  Congruent  Thought Process:  Coherent  Orientation:  Full (Time, Place, and Person)  Thought Content: Illogical, Rumination, and dissociations/derealization    Suicidal Thoughts:   intermittent passive SI without intent or plan  Homicidal Thoughts:  No  Memory:  Immediate;   Fair  Judgement:  Impaired  Insight:  Lacking  Psychomotor Activity:  Normal  Concentration:  Concentration: Fair  Recall:  Fair  Fund of Knowledge: Poor  Language: Good  Akathisia:  NA    AIMS (if indicated): not done  Assets:  Communication Skills Desire for Improvement Housing Transportation  ADL's:  Intact  Cognition: WNL  Sleep:  Fair   Metabolic Disorder Labs: Lab Results  Component Value Date   HGBA1C 5.6 04/26/2014   MPG 114 04/26/2014   No results found for: "PROLACTIN" Lab Results  Component Value Date   CHOL 137 04/26/2014   TRIG 72 04/26/2014   HDL 48 04/26/2014   CHOLHDL 2.9 04/26/2014   VLDL 14 04/26/2014   LDLCALC 75 04/26/2014   No results found for: "TSH"  Therapeutic Level Labs: No results found for: "LITHIUM" No results found for: "VALPROATE" No results found for: "CBMZ"   Screenings: GAD-7    Flowsheet Row Office Visit [facility]03/20[facility]  Total GAD-7 Score 19      PHQ2-9    Flowsheet Row Office Visit from 02/03/2023 in [facility] Office Visit from 12/22/2013 in [facility]  PHQ-2 Total Score 6 0  PHQ-9 Total Score 22 6      Flowsheet Row ED from 02/10/2023 in [facility] ED from 02/04/2023 in [facility] Office Visit from 02/03/2023 in [facility]  C-SSRS RISK CATEGORY No Risk No Risk High Risk       Collaboration of Care: Collaboration of Care: Medication Management AEB medication prescription and Referral or follow-up with counselor/therapist AEB IOP referral  Patient/Guardian was advised Release of Information must be obtained prior to any record release in order to collaborate their care with an outside provider. Patient/Guardian was advised if they have not already  done so to contact the registration department to sign all necessary forms in order for [place] to release information regarding their care.   Consent: Patient/Guardian gives verbal consent for treatment and assignment of benefits for services provided during this visit. Patient/Guardian expressed understanding and agreed to proceed.    Stasia Cavalier, MD 03/06/2023, 11:20 AM   Virtual Visit via Video Note  I connected with Marlaine Hind on 03/06/23 at 10:30 AM EDT by a video enabled telemedicine application and verified that I am speaking with the correct person using two identifiers.  Location: Patient: In the car Provider: Home Office   I discussed the limitations of evaluation and management by telemedicine and the availability of in person appointments. The patient expressed understanding and agreed to proceed.   I discussed the assessment and treatment plan with the patient. The patient was provided an opportunity to ask questions and all were answered. The patient agreed with the plan and demonstrated an understanding of the instructions.   The patient was advised to call back or seek an in-person evaluation if the symptoms worsen or if the condition fails to improve as anticipated.  I provided 25 minutes of non-face-to-face time during this encounter.   Stasia Cavalier, MD

## 2023-03-09 ENCOUNTER — Telehealth (HOSPITAL_COMMUNITY): Payer: Self-pay | Admitting: Psychiatry

## 2023-03-09 NOTE — Telephone Encounter (Signed)
D:  Dr. Mercy Riding referred pt to virtual MH-IOP.  Pt was scheduled to start on 03-11-23, but case mgr called to inform pt that there is an opening tomorrow that came available.  Pt agreed to start tomorrow at 9 a.m.  A:  Pt was less agitated this morning; actually was very polite.  Inform Dr. Mercy Riding and Delanna Ahmadi, LPN; since he had mentioned to the case mgr that he may back date her short term disability if pt attends MH-IOP as recommended.  Case manager didn't voice that to pt.  R:  Pt receptive.

## 2023-03-10 ENCOUNTER — Other Ambulatory Visit (HOSPITAL_COMMUNITY): Payer: 59 | Admitting: Psychiatry

## 2023-03-10 ENCOUNTER — Telehealth (HOSPITAL_COMMUNITY): Payer: Self-pay | Admitting: Psychiatry

## 2023-03-11 ENCOUNTER — Other Ambulatory Visit (HOSPITAL_COMMUNITY): Payer: 59

## 2023-03-12 ENCOUNTER — Emergency Department (HOSPITAL_BASED_OUTPATIENT_CLINIC_OR_DEPARTMENT_OTHER)
Admission: EM | Admit: 2023-03-12 | Discharge: 2023-03-12 | Discharge status: Against Medical Advice | Payer: 59 | Attending: Emergency Medicine | Admitting: Emergency Medicine

## 2023-03-12 ENCOUNTER — Other Ambulatory Visit: Payer: Self-pay

## 2023-03-12 ENCOUNTER — Encounter (HOSPITAL_BASED_OUTPATIENT_CLINIC_OR_DEPARTMENT_OTHER): Payer: Self-pay

## 2023-03-12 ENCOUNTER — Ambulatory Visit (HOSPITAL_COMMUNITY): Payer: 59

## 2023-03-12 DIAGNOSIS — R110 Nausea: Secondary | ICD-10-CM | POA: Diagnosis not present

## 2023-03-12 DIAGNOSIS — Z5321 Procedure and treatment not carried out due to patient leaving prior to being seen by health care provider: Secondary | ICD-10-CM | POA: Insufficient documentation

## 2023-03-12 DIAGNOSIS — R42 Dizziness and giddiness: Secondary | ICD-10-CM | POA: Diagnosis present

## 2023-03-12 LAB — URINALYSIS, ROUTINE W REFLEX MICROSCOPIC
Bilirubin Urine: NEGATIVE
Glucose, UA: NEGATIVE mg/dL
Ketones, ur: NEGATIVE mg/dL
Leukocytes,Ua: NEGATIVE
Nitrite: NEGATIVE
Protein, ur: NEGATIVE mg/dL
Specific Gravity, Urine: 1.01 (ref 1.005–1.030)
pH: 5.5 (ref 5.0–8.0)

## 2023-03-12 LAB — CBC
HCT: 41.9 % (ref 36.0–46.0)
Hemoglobin: 14.1 g/dL (ref 12.0–15.0)
MCH: 32.1 pg (ref 26.0–34.0)
MCHC: 33.7 g/dL (ref 30.0–36.0)
MCV: 95.4 fL (ref 80.0–100.0)
Platelets: 376 10*3/uL (ref 150–400)
RBC: 4.39 MIL/uL (ref 3.87–5.11)
RDW: 12.6 % (ref 11.5–15.5)
WBC: 12.9 10*3/uL — ABNORMAL HIGH (ref 4.0–10.5)
nRBC: 0 % (ref 0.0–0.2)

## 2023-03-12 LAB — URINALYSIS, MICROSCOPIC (REFLEX)

## 2023-03-12 LAB — BASIC METABOLIC PANEL
Anion gap: 9 (ref 5–15)
BUN: 11 mg/dL (ref 6–20)
CO2: 25 mmol/L (ref 22–32)
Calcium: 9.3 mg/dL (ref 8.9–10.3)
Chloride: 101 mmol/L (ref 98–111)
Creatinine, Ser: 0.86 mg/dL (ref 0.44–1.00)
GFR, Estimated: >60 mL/min (ref >60)
Glucose, Bld: 89 mg/dL (ref 70–99)
Potassium: 3.7 mmol/L (ref 3.5–5.1)
Sodium: 135 mmol/L (ref 135–145)

## 2023-03-12 LAB — PREGNANCY, URINE: Preg Test, Ur: NEGATIVE

## 2023-03-12 LAB — CBG MONITORING, ED: Glucose-Capillary: 104 mg/dL — ABNORMAL HIGH (ref 70–99)

## 2023-03-12 NOTE — ED Triage Notes (Signed)
C/o lightheadedness when standing since last night and nausea. Itchy rash x few days under neck. States had cold like symptoms x 4 days.

## 2023-03-12 NOTE — ED Notes (Signed)
No answer for room x3 

## 2023-03-13 ENCOUNTER — Ambulatory Visit (HOSPITAL_COMMUNITY): Payer: 59

## 2023-03-17 ENCOUNTER — Ambulatory Visit (HOSPITAL_COMMUNITY): Payer: 59

## 2023-03-18 ENCOUNTER — Ambulatory Visit (HOSPITAL_COMMUNITY): Payer: 59

## 2023-03-19 ENCOUNTER — Ambulatory Visit (HOSPITAL_COMMUNITY): Payer: 59

## 2023-03-20 ENCOUNTER — Ambulatory Visit (HOSPITAL_COMMUNITY): Payer: 59

## 2023-03-23 ENCOUNTER — Ambulatory Visit (HOSPITAL_COMMUNITY): Payer: 59

## 2023-03-24 ENCOUNTER — Ambulatory Visit (HOSPITAL_COMMUNITY): Payer: 59

## 2023-03-25 ENCOUNTER — Ambulatory Visit (HOSPITAL_COMMUNITY): Payer: 59

## 2023-03-26 ENCOUNTER — Ambulatory Visit (HOSPITAL_COMMUNITY): Payer: 59

## 2023-03-27 ENCOUNTER — Ambulatory Visit (HOSPITAL_COMMUNITY): Payer: 59

## 2023-04-17 ENCOUNTER — Encounter (HOSPITAL_COMMUNITY): Payer: 59 | Admitting: Psychiatry

## 2023-04-17 ENCOUNTER — Encounter (HOSPITAL_COMMUNITY): Payer: Self-pay

## 2023-04-17 NOTE — Progress Notes (Signed)
This encounter was created in error - please disregard.

## 2023-04-17 NOTE — Progress Notes (Deleted)
BH MD/PA/NP OP Progress Note  04/17/2023 8:03 AM Regina Miller  MRN:  [id number]  Visit Diagnosis:  No diagnosis found.   Assessment: Regina Miller is a 27 y.o. female with a history of anxiety and reported ADHD who presented to [facility] at [facility] for initial evaluation on 02/03/2023.  At initial evaluation patient reported symptoms of mood lability ranging from euthymic to depressed, tearful, and passively suicidal.  Patient is incongruent in her responses saying that she would never try to kill herself, but also that she made a suicide attempt a few days before initial evaluation.  Patient however had not considered that a suicide attempt as she was more curious about how strangling herself and feeling.  We discussed safety planning and crisis resources with the patient.  On review she did endorse a history of significant past trauma and experiences nightmares/flashbacks, emotional numbness, hypervigilance, increased startle response, and poor sleep.  She was also screened for borderline personality disorder and endorsed symptoms of poor sense of self, real or perceived feelings of abandonment, unstable interpersonal relationships, splitting, impulsive behaviors (binging, excessive spending, and promiscuity), NSSI (tattoos and piercings), mood lability often related to interpersonal stressors, chronic feelings of emptiness, and dissociative symptoms (which have decreased as she has gotten older).   At follow-up evaluation patient had endorsed increased anxiety symptoms including excessive worry, difficulty controlling the worrying, difficulty relaxing, increased restlessness, increased irritability, and fears something awful happening.  Anxiety symptoms were worse in social situations. Patient met criteria for PTSD, GAD, and borderline personality disorder.    Regina Miller presents for follow-up evaluation. Today, 04/17/23, patient reports    slight  improvement in anxiety and depressive symptoms, though still can experience passive SI. At those times she will reach out to family for support who will come and sit with her. Patient also endorses experienced an increase/change in her dissociative (derealization) symptoms over the past month. While we can not rule out a psychotic disorder at this time, it appears less likely due to the lack of negative symptoms, patient remaining organized, and lack of family history. We will titrate Lexapro to 10 mg QD and start Abilify 2 mg QD. Risks and benefits were reviewed. Patient was also referred for the IOP program.  Plan: - Increase Lexapro to 10 mg QD - Start Abilify 2 mg QD - Continue Atarax to 25 mg TID prn anxiety - Neuropsych testing referral - Crisis resources reviewed - LOA letter provided from 4/17 to 5/6, can consider extension if patient enrolled in IOP/PHP - IOP program referral - Follow up in a month   Chief Complaint:  Chief Complaint  Patient presents with   Follow-up   HPI: Patient had been connected with IOP in the interim and had a start date of 5/21 after her appointment on 5/17. Patient did not attend the program however and did not answer when reached out to.     presents report that she does not feel that she is better this past month. Regina Miller reports that she can have moments where she feels like she is dissociating now, which she had not experienced previously. Patient describes it as getting a feeling like the world or the people around her are fake. This has led her to isolate more at home, and she has not returned to work. Regina Miller reports that the anxiety/depression have improved slightly, but she still does experience suicidal thoughts. When this occurs she will reach out to her family for support  and they will often come over which helps. While she finds this helpful she can experience dissociations while they are there, similarly along the lines that her family is not  real. We reviewed these symptoms and explored concern for schizophrenia. Patient denied auditory or visual hallucinations or signs of disorganization. Patient is also unaware of any psychotic illnesses in her family. We explained at this time her symptoms seem to be more consistent with dissociations that can be seen in BPD as opposed to a psychotic illness. Based off her current symptoms and continued anxiety and depression it would be appropriate to titrate Lexapro and start Abilify. Risks and benefits were explained. It was also decided to start Abilify at low doses due to patient's sensitivity to medication in the past.   We also recommended patient attend a PHP or IOP program and provided a referral. Patient was open to consider one of these programs. She had also asked about FMLA and it was explained that extending it could be considered if she was enrolled in a higher level of care such as IOP.  Past Psychiatric History: Patient denies any past psychiatric hospitalizations. She notes a suicide attempt in April 2024 where she tried to strangle herself.  Though on looking back she reports that not to say being suicidal in nature and instead is curious on how it would feel.  She denies experiencing any symptoms of lightheadedness or passing out upon doing this.  Adderall, propranolol, and Seroquel in the past.  She denies alcohol or other substance use  Past Medical History:  Past Medical History:  Diagnosis Date   Acid reflux    Anxiety    Asthma    Attention deficit disorder    Heart murmur    Reports she had a heart murmur as a baby that has been evaluated. She is not currently followed by a cardiologist.    Past Surgical History:  Procedure Laterality Date   TONSILLECTOMY     TYMPANOSTOMY TUBE PLACEMENT Bilateral     Family History:  Family History  Problem Relation Age of Onset   Psoriasis Mother    Hypertension Father    Eczema Father    Diabetes Father    Heart disease  Paternal Grandmother    Cancer Maternal Grandmother     Social History:  Social History   Socioeconomic History   Marital status: Single    Spouse name: Not on file   Number of children: Not on file   Years of education: Not on file   Highest education level: Not on file  Occupational History   Not on file  Tobacco Use   Smoking status: Former    Packs/day: 1    Types: Cigarettes   Smokeless tobacco: Never   Tobacco comments:    Parents smoke around her  Vaping Use   Vaping Use: Never used  Substance and Sexual Activity   Alcohol use: Yes    Comment: occ   Drug use: Not Currently    Types: Marijuana   Sexual activity: Yes    Birth control/protection: None  Other Topics Concern   Not on file  Social History Narrative   Lives with father and step-mom. Mother lives in [place]. Has multiple siblings through dad but none who live at home with her as they are all adults.   Social Determinants of Health   Financial Resource Strain: Not on file  Food Insecurity: Not on file  Transportation Needs: Not on file  Physical Activity:  Not on file  Stress: Not on file  Social Connections: Not on file    Allergies:  Allergies  Allergen Reactions   Kiwi Extract Nausea And Vomiting   Other Hives    Nair hair removal    Current Medications: Current Outpatient Medications  Medication Sig Dispense Refill   ARIPiprazole (ABILIFY) 2 MG tablet Take 1 tablet (2 mg total) by mouth daily. 30 tablet 2   escitalopram (LEXAPRO) 10 MG tablet Take 1 tablet (10 mg total) by mouth daily. 30 tablet 2   hydrOXYzine (ATARAX) 25 MG tablet Take 1 tablet (25 mg total) by mouth at bedtime as needed for anxiety. 90 tablet 1   No current facility-administered medications for this visit.     Psychiatric Specialty Exam: Review of Systems  There were no vitals taken for this visit.There is no height or weight on file to calculate BMI.  General Appearance: Disheveled and Fairly Groomed  Eye  Contact:  Minimal  Speech:  Clear and Coherent and Normal Rate  Volume:  Normal  Mood:  Anxious  Affect:  Congruent  Thought Process:  Coherent  Orientation:  Full (Time, Place, and Person)  Thought Content: Illogical, Rumination, and dissociations/derealization    Suicidal Thoughts:   intermittent passive SI without intent or plan  Homicidal Thoughts:  No  Memory:  Immediate;   Fair  Judgement:  Impaired  Insight:  Lacking  Psychomotor Activity:  Normal  Concentration:  Concentration: Fair  Recall:  Fair  Fund of Knowledge: Poor  Language: Good  Akathisia:  NA    AIMS (if indicated): not done  Assets:  Communication Skills Desire for Improvement Housing Transportation  ADL's:  Intact  Cognition: WNL  Sleep:  Fair   Metabolic Disorder Labs: Lab Results  Component Value Date   HGBA1C 5.6 04/26/2014   MPG 114 04/26/2014   No results found for: "PROLACTIN" Lab Results  Component Value Date   CHOL 137 04/26/2014   TRIG 72 04/26/2014   HDL 48 04/26/2014   CHOLHDL 2.9 04/26/2014   VLDL 14 04/26/2014   LDLCALC 75 04/26/2014   No results found for: "TSH"  Therapeutic Level Labs: No results found for: "LITHIUM" No results found for: "VALPROATE" No results found for: "CBMZ"   Screenings: GAD-7    Flowsheet Row Office Visit [facility]03/20[facility]  Total GAD-7 Score 19      PHQ2-9    Flowsheet Row Office Visit from 02/03/2023 in [facility] Office Visit from 12/22/2013 in [facility]  PHQ-2 Total Score 6 0  PHQ-9 Total Score 22 6      Flowsheet Row ED from 03/12/2023 in [facility] Department at [facility] ED from 02/10/2023 in [facility] ED from 02/04/2023 in [facility] Department at [facility]  C-SSRS RISK CATEGORY No Risk No Risk No Risk        Collaboration of Care: Collaboration of Care: Medication Management AEB medication prescription and Referral or follow-up with counselor/therapist AEB IOP referral  Patient/Guardian was advised Release of Information must be obtained prior to any record release in order to collaborate their care with an outside provider. Patient/Guardian was advised if they have not already done so to contact the registration department to sign all necessary forms in order for [place] to release information regarding their care.   Consent: Patient/Guardian gives verbal consent  for treatment and assignment of benefits for services provided during this visit. Patient/Guardian expressed understanding and agreed to proceed.    Stasia Cavalier, MD 04/17/2023, 8:03 AM   Virtual Visit via Video Note  I connected with Regina Miller on 04/17/23 at  9:30 AM EDT by a video enabled telemedicine application and verified that I am speaking with the correct person using two identifiers.  Location: Patient: In the car Provider: Home Office   I discussed the limitations of evaluation and management by telemedicine and the availability of in person appointments. The patient expressed understanding and agreed to proceed.   I discussed the assessment and treatment plan with the patient. The patient was provided an opportunity to ask questions and all were answered. The patient agreed with the plan and demonstrated an understanding of the instructions.   The patient was advised to call back or seek an in-person evaluation if the symptoms worsen or if the condition fails to improve as anticipated.  I provided 25 minutes of non-face-to-face time during this encounter.   Stasia Cavalier, MD

## 2023-06-16 ENCOUNTER — Ambulatory Visit (HOSPITAL_COMMUNITY)
Admission: EM | Admit: 2023-06-16 | Discharge: 2023-06-16 | Disposition: A | Discharge status: Home / Self Care | Payer: 59 | Attending: Physician Assistant | Admitting: Physician Assistant

## 2023-06-16 ENCOUNTER — Encounter (HOSPITAL_COMMUNITY): Payer: Self-pay | Admitting: Emergency Medicine

## 2023-06-16 DIAGNOSIS — G4700 Insomnia, unspecified: Secondary | ICD-10-CM | POA: Diagnosis not present

## 2023-06-16 DIAGNOSIS — F411 Generalized anxiety disorder: Secondary | ICD-10-CM

## 2023-06-16 MED ORDER — HYDROXYZINE HCL 25 MG PO TABS: 25.0000 mg | ORAL_TABLET | Freq: Every evening | ORAL | 0 refills | Status: DC | PRN

## 2023-06-16 NOTE — ED Triage Notes (Signed)
Pt reports hasn't slept in 4 days. Tried Tylenol PM and her prescribed medication that havent helped. Reports anxiety is bad.  Pt c/o headache

## 2023-06-16 NOTE — ED Provider Notes (Signed)
[facility]    CSN: [id number] Arrival date & time: 06/16/23  1158      History   Chief Complaint Chief Complaint  Patient presents with   Insomnia    HPI Regina Miller is a 27 y.o. female.   Patient presents today with a prolonged history of anxiety that has led to worsening insomnia.  Over the past 4 days she has only slept for 1 hour at a time and feels very groggy and overwhelmed.  She does report increased attritional stressors and also believes that the fluctuating schedule at her work where she sometimes works days and sometimes works nights is contributing to her symptoms.  She has tried Tylenol PM as well as previously prescribed hydroxyzine but this has not provided any relief of symptoms.  She reports falling asleep after taking the medication only to wake up suddenly approximately an hour later and then is unable to go back to sleep.  She does report adhering to sleep hygiene practices.  She denies any thoughts of wanting to harm herself or anyone else.  She has been to the behavioral urgent care in the past but is not currently followed by psychiatry.  She is not taking any additional medication besides the previously prescribed hydroxyzine.  She does not currently have a primary care provider.  She does report occasional heart racing and palpitations when she is feeling anxious but states this is similar to previous anxiety attacks and unchanged from her baseline.  Denies any current concerning symptoms.    Past Medical History:  Diagnosis Date   Acid reflux    Anxiety    Asthma    Attention deficit disorder    Heart murmur    Reports she had a heart murmur as a baby that has been evaluated. She is not currently followed by a cardiologist.    Patient Active Problem List   Diagnosis Date Noted   GAD (generalized anxiety disorder) 02/12/2023   PTSD (post-traumatic stress disorder) 02/03/2023   Borderline personality disorder (HCC) 02/03/2023   Morbid  obesity (HCC) 07/19/2019   First trimester pregnancy 07/26/2017   Anxiety 12/22/2013   Eczema 12/22/2013   Body mass index, pediatric, greater than or 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 age 47/02/2014   Acne 04/26/2012   Insomnia 07/06/2011   GERD 09/19/2008   ATTENTION DEFICIT, W/HYPERACTIVITY 12/17/2006    Past Surgical History:  Procedure Laterality Date   TONSILLECTOMY     TYMPANOSTOMY TUBE PLACEMENT Bilateral     OB History     Gravida  1   Para      Term      Preterm      AB      Living         SAB      IAB      Ectopic      Multiple      Live Births               Home Medications    Prior to Admission medications   Medication Sig Start Date End Date Taking? Authorizing Provider  hydrOXYzine (ATARAX) 25 MG tablet Take 1-2 tablets (25-50 mg total) by mouth at bedtime as needed for anxiety. 06/16/23   Darneisha Windhorst, Noberto Retort, PA-C  diphenhydrAMINE (BENADRYL) 25 mg capsule Take 1 capsule (25 mg total) by mouth every 8 (eight) hours as needed for allergies. 07/19/19 10/18/19  Betancourt, Jarold Song, NP  loratadine (CLARITIN) 10 MG tablet Take 1 tablet (  10 mg total) by mouth daily. 07/19/19 10/18/19  Betancourt, Jarold Song, NP    Family History Family History  Problem Relation Age of Onset   Psoriasis Mother    Hypertension Father    Eczema Father    Diabetes Father    Heart disease Paternal Grandmother    Cancer Maternal Grandmother     Social History Social History   Tobacco Use   Smoking status: Former    Current packs/day: 1.00    Types: Cigarettes   Smokeless tobacco: Never   Tobacco comments:    Parents smoke around her  Vaping Use   Vaping status: Never Used  Substance Use Topics   Alcohol use: Yes    Comment: occ   Drug use: Not Currently    Types: Marijuana     Allergies   Kiwi extract and Other   Review of Systems Review of Systems  Respiratory:  Negative for shortness of breath.   Cardiovascular:  Negative for chest pain and  palpitations.  Gastrointestinal:  Negative for abdominal pain, diarrhea, nausea and vomiting.  Musculoskeletal:  Negative for arthralgias and myalgias.  Psychiatric/Behavioral:  Positive for sleep disturbance. Negative for self-injury and suicidal ideas. The patient is nervous/anxious.      Physical Exam Triage Vital Signs ED Triage Vitals  Encounter Vitals Group     BP 06/16/23 1324 105/71     Systolic BP Percentile --      Diastolic BP Percentile --      Pulse Rate 06/16/23 1324 73     Resp 06/16/23 1324 15     Temp 06/16/23 1324 98.1 F (36.7 C)     Temp Source 06/16/23 1324 Oral     SpO2 06/16/23 1324 98 %     Weight --      Height --      Head Circumference --      Peak Flow --      Pain Score 06/16/23 1323 3     Pain Loc --      Pain Education --      Exclude from Growth Chart --    No data found.  Updated Vital Signs BP 105/71 (BP Location: Left Arm)   Pulse 73   Temp 98.1 F (36.7 C) (Oral)   Resp 15   LMP 05/31/2023   SpO2 98%   Visual Acuity Right Eye Distance:   Left Eye Distance:   Bilateral Distance:    Right Eye Near:   Left Eye Near:    Bilateral Near:     Physical Exam Vitals reviewed.  Constitutional:      General: She is awake. She is not in acute distress.    Appearance: Normal appearance. She is well-developed. She is not ill-appearing.     Comments: Very pleasant female appears stated age in no acute distress sitting comfortably in exam room  HENT:     Head: Normocephalic and atraumatic.  Cardiovascular:     Rate and Rhythm: Normal rate and regular rhythm.     Heart sounds: Normal heart sounds, S1 normal and S2 normal. No murmur heard. Pulmonary:     Effort: Pulmonary effort is normal.     Breath sounds: Normal breath sounds. No wheezing, rhonchi or rales.     Comments: Clear to auscultation bilaterally Abdominal:     Palpations: Abdomen is soft.     Tenderness: There is no abdominal tenderness.  Psychiatric:        Mood and  Affect: Affect  is tearful.        Speech: Speech normal.        Behavior: Behavior is cooperative.        Thought Content: Thought content normal.     Comments: Patient is tearful at times.  Appropriate speech.  Normal appearance/hygiene.  Maintains eye contact.      UC Treatments / Results  Labs (all labs ordered are listed, but only abnormal results are displayed) Labs Reviewed - No data to display  EKG   Radiology No results found.  Procedures Procedures (including critical care time)  Medications Ordered in UC Medications - No data to display  Initial Impression / Assessment and Plan / UC Course  I have reviewed the triage vital signs and the nursing notes.  Pertinent labs & imaging results that were available during my care of the patient were reviewed by me and considered in my medical decision making (see chart for details).     Patient is well-appearing, afebrile, nontoxic, nontachycardic.  Vital signs and physical exam are reassuring with no indication for emergent evaluation or imaging.  We did discuss potential utility of EKG and additional workup given her intermittent palpitations but she reports that these are only present when she is having anxiety attacks and have been ongoing for a long time.  Discussed that if she has persistent or changing symptoms she should return for reevaluation.  We are limited in the medications that can be started in urgent care but will increase her dose of hydroxyzine to between 25 and 50 mg at night as needed.  We discussed the sedating and she should not drive or drink alcohol taking it.  Also discussed that she should not take additional over-the-counter antihistamines due to concern for side effects.  If he has any allergy symptoms she can use fluticasone nasal spray to manage this.  Recommend she follow-up with behavioral health urgent care for further evaluation and management and ongoing management of the anxiety.  She does not  currently have a primary care so will try to establish care with someone via PCP assistance.  She denies any thoughts of suicide/self-harm and contracts for safety today.  We discussed that if she has any worsening or changing symptoms she should be seen immediately to which she expressed understanding.  Strict return precautions given.  Work excuse note provided.  Final Clinical Impressions(s) / UC Diagnoses   Final diagnoses:  Insomnia, unspecified type  Anxiety state     Discharge Instructions      We are treating you for anxiety and insomnia.  Please take between 1 and 2 tablets of hydroxyzine at night.  This will make you sleepy so do not drive or drink alcohol taking it.  Do not combine it with any over-the-counter antihistamine such as Benadryl, Tylenol PM, Allegra, Claritin, Zyrtec.  If you are having allergy symptoms you can use fluticasone nasal spray for additional symptom relief.  I would like you to follow-up with behavioral health urgent care for ongoing management of the anxiety.  I am also putting in for you to follow-up with any primary care; someone should call you to schedule an appointment with them.  If you have any worsening symptoms including increasing anxiety or thoughts of wanting to hurt yourself or anyone else you need to be seen immediately.     ED Prescriptions     Medication Sig Dispense Auth. Provider   hydrOXYzine (ATARAX) 25 MG tablet Take 1-2 tablets (25-50 mg total) by mouth  at bedtime as needed for anxiety. 20 tablet [place] Howington, Noberto Retort, PA-C      PDMP not reviewed this encounter.   Jeani Hawking, PA-C 06/16/23 1400

## 2023-06-16 NOTE — Discharge Instructions (Signed)
We are treating you for anxiety and insomnia.  Please take between 1 and 2 tablets of hydroxyzine at night.  This will make you sleepy so do not drive or drink alcohol taking it.  Do not combine it with any over-the-counter antihistamine such as Benadryl, Tylenol PM, Allegra, Claritin, Zyrtec.  If you are having allergy symptoms you can use fluticasone nasal spray for additional symptom relief.  I would like you to follow-up with behavioral health urgent care for ongoing management of the anxiety.  I am also putting in for you to follow-up with any primary care; someone should call you to schedule an appointment with them.  If you have any worsening symptoms including increasing anxiety or thoughts of wanting to hurt yourself or anyone else you need to be seen immediately.

## 2023-06-19 ENCOUNTER — Encounter (HOSPITAL_COMMUNITY): Payer: Self-pay

## 2023-06-19 ENCOUNTER — Other Ambulatory Visit: Payer: Self-pay

## 2023-06-19 ENCOUNTER — Encounter (HOSPITAL_COMMUNITY): Payer: Self-pay | Admitting: *Deleted

## 2023-06-19 ENCOUNTER — Ambulatory Visit (HOSPITAL_COMMUNITY)
Admission: EM | Admit: 2023-06-19 | Discharge: 2023-06-19 | Disposition: A | Discharge status: Home / Self Care | Payer: 59

## 2023-06-19 DIAGNOSIS — G4700 Insomnia, unspecified: Secondary | ICD-10-CM

## 2023-06-19 DIAGNOSIS — F419 Anxiety disorder, unspecified: Secondary | ICD-10-CM

## 2023-06-19 NOTE — ED Provider Notes (Addendum)
[facility]    CSN: [id number] Arrival date & time: 06/19/23  1723      History   Chief Complaint Chief Complaint  Patient presents with   Insomnia   Panic Attack   Anxiety    HPI Regina Miller is a 27 y.o. female.   Patient presents to clinic for continued insomnia and anxiety.  She has tried the hydroxyzine prescribed at her previous visit and only got 2 hours of sleep.  She feels really tired, will go to bed and then wake up about an hour or so later.  Endorses increased stress recently.  She does work with heavy machinery at work and is concerned that she is going to injure herself.  Has been so tired that she feels like she might be visually hallucinating sometimes.  Denies any auditory hallucinations. Denies SI.   She has not yet set up a PCP.   The history is provided by the patient and medical records.  Insomnia  Anxiety    Past Medical History:  Diagnosis Date   Acid reflux    Anxiety    Asthma    Attention deficit disorder    Heart murmur    Reports she had a heart murmur as a baby that has been evaluated. She is not currently followed by a cardiologist.    Patient Active Problem List   Diagnosis Date Noted   GAD (generalized anxiety disorder) 02/12/2023   PTSD (post-traumatic stress disorder) 02/03/2023   Borderline personality disorder (HCC) 02/03/2023   Morbid obesity (HCC) 07/19/2019   First trimester pregnancy 07/26/2017   Anxiety 12/22/2013   Eczema 12/22/2013   Body mass index, pediatric, greater than or 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 age 22/02/2014   Acne 04/26/2012   Insomnia 07/06/2011   GERD 09/19/2008   ATTENTION DEFICIT, W/HYPERACTIVITY 12/17/2006    Past Surgical History:  Procedure Laterality Date   TONSILLECTOMY     TYMPANOSTOMY TUBE PLACEMENT Bilateral     OB History     Gravida  1   Para      Term      Preterm      AB      Living         SAB      IAB      Ectopic      Multiple       Live Births               Home Medications    Prior to Admission medications   Medication Sig Start Date End Date Taking? Authorizing Provider  hydrOXYzine (ATARAX) 25 MG tablet Take 1-2 tablets (25-50 mg total) by mouth at bedtime as needed for anxiety. 06/16/23  Yes Raspet, Denny Peon K, PA-C  diphenhydrAMINE (BENADRYL) 25 mg capsule Take 1 capsule (25 mg total) by mouth every 8 (eight) hours as needed for allergies. 07/19/19 10/18/19  Betancourt, Jarold Song, NP  loratadine (CLARITIN) 10 MG tablet Take 1 tablet (10 mg total) by mouth daily. 07/19/19 10/18/19  Betancourt, Jarold Song, NP    Family History Family History  Problem Relation Age of Onset   Psoriasis Mother    Hypertension Father    Eczema Father    Diabetes Father    Heart disease Paternal Grandmother    Cancer Maternal Grandmother     Social History Social History   Tobacco Use   Smoking status: Former    Current packs/day: 1.00    Types: Cigarettes   Smokeless  tobacco: Never   Tobacco comments:    Parents smoke around her  Vaping Use   Vaping status: Never Used  Substance Use Topics   Alcohol use: Yes    Comment: occ   Drug use: Not Currently    Types: Marijuana     Allergies   Kiwi extract and Other   Review of Systems Review of Systems  Constitutional:  Positive for fatigue.  Psychiatric/Behavioral:  The patient has insomnia.      Physical Exam Triage Vital Signs ED Triage Vitals  Encounter Vitals Group     BP 06/19/23 1759 107/76     Systolic BP Percentile --      Diastolic BP Percentile --      Pulse Rate 06/19/23 1759 76     Resp 06/19/23 1759 16     Temp 06/19/23 1759 98.1 F (36.7 C)     Temp src --      SpO2 06/19/23 1759 97 %     Weight --      Height --      Head Circumference --      Peak Flow --      Pain Score 06/19/23 1756 0     Pain Loc --      Pain Education --      Exclude from Growth Chart --    No data found.  Updated Vital Signs BP 107/76   Pulse 76   Temp 98.1 F  (36.7 C)   Resp 16   LMP 05/31/2023   SpO2 97%   Visual Acuity Right Eye Distance:   Left Eye Distance:   Bilateral Distance:    Right Eye Near:   Left Eye Near:    Bilateral Near:     Physical Exam Vitals and nursing note reviewed.  Constitutional:      Appearance: Normal appearance.  HENT:     Head: Normocephalic and atraumatic.     Right Ear: External ear normal.     Left Ear: External ear normal.     Nose: Nose normal.     Mouth/Throat:     Mouth: Mucous membranes are moist.  Eyes:     Conjunctiva/sclera: Conjunctivae normal.  Cardiovascular:     Rate and Rhythm: Normal rate.  Pulmonary:     Effort: Pulmonary effort is normal. No respiratory distress.  Neurological:     General: No focal deficit present.     Mental Status: She is alert and oriented to person, place, and time.  Psychiatric:        Mood and Affect: Mood normal.      UC Treatments / Results  Labs (all labs ordered are listed, but only abnormal results are displayed) Labs Reviewed - No data to display  EKG   Radiology No results found.  Procedures Procedures (including critical care time)  Medications Ordered in UC Medications - No data to display  Initial Impression / Assessment and Plan / UC Course  I have reviewed the triage vital signs and the nursing notes.  Pertinent labs & imaging results that were available during my care of the patient were reviewed by me and considered in my medical decision making (see chart for details).  Vitals and triage reviewed, patient is hemodynamically stable.  Presents to clinic for continued insomnia despite hydroxyzine use.  Discussed healthy sleep habits.  Denies any auditory hallucinations, endorsed some visual hallucinations due to fatigue.  Denies SI.  Does not appear to be acutely manic or a  danger to herself or others.  She is fidgeting in the room.  Encouraged to follow-up with behavioral health urgent care for further evaluation, given [facility]  resources.  Also encouraged to obtain a PCP.  Plan of care, follow-up care and return precautions given, no questions at this time.     Final Clinical Impressions(s) / UC Diagnoses   Final diagnoses:  Insomnia, unspecified type  Anxiety     Discharge Instructions      You can continue to take 1 to 2 tablets of the hydroxyzine by mouth at bedtime as needed for anxiety.  I suggest staying off of your phone including all fall TVs prior to going to bed.  You can take a warm bath, meditate or try deep breathing exercises as well.  If this persist or you are unable to tolerate the insomnia please follow-up with the behavioral health urgent care for further evaluation.  This would also be beneficial to follow-up with a primary care provider.      ED Prescriptions   None    PDMP not reviewed this encounter.       Rinaldo Ratel [place], [place] 06/19/23 [phone]

## 2023-06-19 NOTE — Discharge Instructions (Signed)
You can continue to take 1 to 2 tablets of the hydroxyzine by mouth at bedtime as needed for anxiety.  I suggest staying off of your phone including all fall TVs prior to going to bed.  You can take a warm bath, meditate or try deep breathing exercises as well.  If this persist or you are unable to tolerate the insomnia please follow-up with the behavioral health urgent care for further evaluation.  This would also be beneficial to follow-up with a primary care provider.

## 2023-06-19 NOTE — ED Triage Notes (Addendum)
Pt reports the Med prescribed on 06-16-23 only helped her sleep for 2 Hrs. Pt reports feeling agitated. Pt reports she has a lot going on right now causing anxiety.

## 2023-07-22 ENCOUNTER — Other Ambulatory Visit: Payer: Self-pay

## 2023-07-22 ENCOUNTER — Emergency Department (HOSPITAL_BASED_OUTPATIENT_CLINIC_OR_DEPARTMENT_OTHER): Payer: 59

## 2023-07-22 ENCOUNTER — Encounter (HOSPITAL_BASED_OUTPATIENT_CLINIC_OR_DEPARTMENT_OTHER): Payer: Self-pay

## 2023-07-22 ENCOUNTER — Emergency Department (HOSPITAL_BASED_OUTPATIENT_CLINIC_OR_DEPARTMENT_OTHER)
Admission: EM | Admit: 2023-07-22 | Discharge: 2023-07-23 | Disposition: A | Discharge status: Home / Self Care | Payer: 59 | Attending: Emergency Medicine | Admitting: Emergency Medicine

## 2023-07-22 DIAGNOSIS — R059 Cough, unspecified: Secondary | ICD-10-CM | POA: Diagnosis present

## 2023-07-22 DIAGNOSIS — Z20822 Contact with and (suspected) exposure to covid-19: Secondary | ICD-10-CM | POA: Diagnosis not present

## 2023-07-22 DIAGNOSIS — J45909 Unspecified asthma, uncomplicated: Secondary | ICD-10-CM | POA: Insufficient documentation

## 2023-07-22 DIAGNOSIS — R052 Subacute cough: Secondary | ICD-10-CM | POA: Diagnosis not present

## 2023-07-22 LAB — RESP PANEL BY RT-PCR (RSV, FLU A&B, COVID)  RVPGX2
Influenza A by PCR: NEGATIVE
Influenza B by PCR: NEGATIVE
Resp Syncytial Virus by PCR: NEGATIVE
SARS Coronavirus 2 by RT PCR: NEGATIVE

## 2023-07-22 MED ORDER — DEXAMETHASONE SODIUM PHOSPHATE 10 MG/ML IJ SOLN
10.0000 mg | Freq: Once | INTRAMUSCULAR | Status: AC
  Administered 2023-07-22: 10 mg via INTRAMUSCULAR
  Filled 2023-07-22: qty 1

## 2023-07-22 NOTE — ED Provider Notes (Signed)
[place] EMERGENCY DEPARTMENT AT [facility] Provider Note   CSN: [id number] Arrival date & time: 07/22/23  2059     History {Add pertinent medical, surgical, social history, OB history to HPI:1} Chief Complaint  Patient presents with   Shortness of Breath    Regina Miller is a 27 y.o. female.  HPI     This is a 27 year old female with a history of asthma who presents with shortness of breath and cough.  Patient reports symptoms for the last 2 weeks.  She is a current smoker.  She has been using her inhaler with minimal relief.  Reports fever at home to 101 earlier today.  She took Tylenol prior to arrival.  Reports nonproductive cough.  Denies chest pain, abdominal pain, nausea, vomiting.  Home Medications Prior to Admission medications   Medication Sig Start Date End Date Taking? Authorizing Provider  hydrOXYzine (ATARAX) 25 MG tablet Take 1-2 tablets (25-50 mg total) by mouth at bedtime as needed for anxiety. 06/16/23   Raspet, Noberto Retort, PA-C  diphenhydrAMINE (BENADRYL) 25 mg capsule Take 1 capsule (25 mg total) by mouth every 8 (eight) hours as needed for allergies. 07/19/19 10/18/19  Betancourt, Jarold Song, NP  loratadine (CLARITIN) 10 MG tablet Take 1 tablet (10 mg total) by mouth daily. 07/19/19 10/18/19  Betancourt, Jarold Song, NP      Allergies    Kiwi extract and Other    Review of Systems   Review of Systems  Constitutional:  Positive for fever.  Respiratory:  Positive for cough and shortness of breath.   All other systems reviewed and are negative.   Physical Exam Updated Vital Signs BP 111/72 (BP Location: Right Arm)   Pulse 83   Temp 98.1 F (36.7 C)   Resp 18   Ht 1.575 m (5\' 2" )   Wt 107.6 kg   SpO2 99%   BMI 43.39 kg/m  Physical Exam Vitals and nursing note reviewed.  Constitutional:      Appearance: She is well-developed. She is obese. She is not ill-appearing.  HENT:     Head: Normocephalic and atraumatic.  Eyes:     Pupils: Pupils are  equal, round, and reactive to light.  Cardiovascular:     Rate and Rhythm: Normal rate and regular rhythm.     Heart sounds: Normal heart sounds.  Pulmonary:     Effort: Pulmonary effort is normal. No respiratory distress.     Breath sounds: Normal breath sounds. No wheezing.  Abdominal:     General: Bowel sounds are normal.     Palpations: Abdomen is soft.  Musculoskeletal:     Cervical back: Neck supple.  Skin:    General: Skin is warm and dry.  Neurological:     Mental Status: She is alert and oriented to person, place, and time.  Psychiatric:        Mood and Affect: Mood normal.     ED Results / Procedures / Treatments   Labs (all labs ordered are listed, but only abnormal results are displayed) Labs Reviewed  RESP PANEL BY RT-PCR (RSV, FLU A&B, COVID)  RVPGX2    EKG None  Radiology No results found.  Procedures Procedures  {Document cardiac monitor, telemetry assessment procedure when appropriate:1}  Medications Ordered in ED Medications - No data to display  ED Course/ Medical Decision Making/ A&P   {   Click here for ABCD2, HEART and other calculatorsREFRESH Note before signing :1}  Medical Decision Making Amount and/or Complexity of Data Reviewed Radiology: ordered.   ***  {Document critical care time when appropriate:1} {Document review of labs and clinical decision tools ie heart score, Chads2Vasc2 etc:1}  {Document your independent review of radiology images, and any outside records:1} {Document your discussion with family members, caretakers, and with consultants:1} {Document social determinants of health affecting pt's care:1} {Document your decision making why or why not admission, treatments were needed:1} Final Clinical Impression(s) / ED Diagnoses Final diagnoses:  None    Rx / DC Orders ED Discharge Orders     None

## 2023-07-22 NOTE — ED Triage Notes (Signed)
Pov from home, A&O x 4, gcs 15, amb to triage  Pt c/o difficulty breathing 2 weeks, hx of asthma. Using albuterol inhaler more than normal.

## 2023-07-23 NOTE — Discharge Instructions (Signed)
You were seen today for cough and upper respiratory symptoms.  Your workup is reassuring.  Your COVID and influenza testing are negative.  Your chest x-ray does not show pneumonia.  This is likely viral.  You were given a dose of steroids given your history of asthma.  You should consider stopping smoking.

## 2023-12-15 ENCOUNTER — Other Ambulatory Visit: Payer: Self-pay

## 2023-12-15 ENCOUNTER — Emergency Department (HOSPITAL_BASED_OUTPATIENT_CLINIC_OR_DEPARTMENT_OTHER)
Admission: EM | Admit: 2023-12-15 | Discharge: 2023-12-15 | Disposition: A | Discharge status: Home / Self Care | Payer: Self-pay | Attending: Emergency Medicine | Admitting: Emergency Medicine

## 2023-12-15 DIAGNOSIS — J45909 Unspecified asthma, uncomplicated: Secondary | ICD-10-CM | POA: Insufficient documentation

## 2023-12-15 DIAGNOSIS — Z79899 Other long term (current) drug therapy: Secondary | ICD-10-CM | POA: Insufficient documentation

## 2023-12-15 DIAGNOSIS — J121 Respiratory syncytial virus pneumonia: Secondary | ICD-10-CM | POA: Insufficient documentation

## 2023-12-15 DIAGNOSIS — J449 Chronic obstructive pulmonary disease, unspecified: Secondary | ICD-10-CM | POA: Insufficient documentation

## 2023-12-15 DIAGNOSIS — Z20822 Contact with and (suspected) exposure to covid-19: Secondary | ICD-10-CM | POA: Insufficient documentation

## 2023-12-15 DIAGNOSIS — B338 Other specified viral diseases: Secondary | ICD-10-CM

## 2023-12-15 DIAGNOSIS — Z7951 Long term (current) use of inhaled steroids: Secondary | ICD-10-CM | POA: Insufficient documentation

## 2023-12-15 LAB — RESP PANEL BY RT-PCR (RSV, FLU A&B, COVID)  RVPGX2
Influenza A by PCR: NEGATIVE
Influenza B by PCR: NEGATIVE
Resp Syncytial Virus by PCR: POSITIVE — AB
SARS Coronavirus 2 by RT PCR: NEGATIVE

## 2023-12-15 MED ORDER — BENZONATATE 100 MG PO CAPS: 100.0000 mg | ORAL_CAPSULE | Freq: Three times a day (TID) | ORAL | 0 refills | Status: DC

## 2023-12-15 MED ORDER — ALBUTEROL SULFATE HFA 108 (90 BASE) MCG/ACT IN AERS
2.0000 | INHALATION_SPRAY | Freq: Once | RESPIRATORY_TRACT | Status: AC
  Administered 2023-12-15: 2 via RESPIRATORY_TRACT
  Filled 2023-12-15: qty 6.7

## 2023-12-15 MED ORDER — ONDANSETRON HCL 4 MG PO TABS: 4.0000 mg | ORAL_TABLET | Freq: Four times a day (QID) | ORAL | 0 refills | Status: DC

## 2023-12-15 MED ORDER — PREDNISONE 50 MG PO TABS: 50.0000 mg | ORAL_TABLET | Freq: Every day | ORAL | 0 refills | Status: AC

## 2023-12-15 MED ORDER — AZITHROMYCIN 250 MG PO TABS: ORAL_TABLET | ORAL | 0 refills | Status: DC

## 2023-12-15 NOTE — ED Provider Notes (Signed)
 [place] EMERGENCY DEPARTMENT AT [facility] Provider Note   CSN: [id number] Arrival date & time: 12/15/23  1800     History  Chief Complaint  Patient presents with   flu like symptoms    Regina Miller is a 28 y.o. female with history of asthma, anxiety, ADHD, reflux, who presents the emergency department complaining of fever, productive cough, and wheezing for the past 4 days.  States that she has been coughing up yellow/brown mucus.  She has been using her albuterol inhaler with some relief.  She took a few tablets of prednisone that she had left.  She reports having previously had a viral infection and developed pneumonia, and was told that she had COPD.  HPI     Home Medications Prior to Admission medications   Medication Sig Start Date End Date Taking? Authorizing Provider  azithromycin (ZITHROMAX) 250 MG tablet Take 2 tablets (500 mg) on day 1, after that take 1 tablet (250 mg) daily until complete. 12/15/23  Yes Stanely Sexson T, PA-C  benzonatate (TESSALON) 100 MG capsule Take 1 capsule (100 mg total) by mouth every 8 (eight) hours. 12/15/23  Yes Conni Knighton T, PA-C  ondansetron (ZOFRAN) 4 MG tablet Take 1 tablet (4 mg total) by mouth every 6 (six) hours. 12/15/23  Yes Tillman Kazmierski T, PA-C  predniSONE (DELTASONE) 50 MG tablet Take 1 tablet (50 mg total) by mouth daily with breakfast for 5 days. 12/15/23 12/20/23 Yes Caedmon Louque T, PA-C  hydrOXYzine (ATARAX) 25 MG tablet Take 1-2 tablets (25-50 mg total) by mouth at bedtime as needed for anxiety. 06/16/23   Raspet, Noberto Retort, PA-C  diphenhydrAMINE (BENADRYL) 25 mg capsule Take 1 capsule (25 mg total) by mouth every 8 (eight) hours as needed for allergies. 07/19/19 10/18/19  Betancourt, Jarold Song, NP  loratadine (CLARITIN) 10 MG tablet Take 1 tablet (10 mg total) by mouth daily. 07/19/19 10/18/19  Betancourt, Jarold Song, NP      Allergies    Kiwi extract and Other    Review of Systems   Review of Systems   Constitutional:  Positive for fever.  HENT:  Positive for congestion.   Respiratory:  Positive for cough and wheezing.   Gastrointestinal:  Positive for nausea.  All other systems reviewed and are negative.   Physical Exam Updated Vital Signs BP 101/70 (BP Location: Left Arm)   Pulse 88   Temp 98.6 F (37 C)   Resp 18   Ht 5\' 1"  (1.549 m)   Wt 108 kg   LMP 11/29/2023 (Exact Date)   SpO2 97%   BMI 44.99 kg/m  Physical Exam Vitals and nursing note reviewed.  Constitutional:      Appearance: Normal appearance.  HENT:     Head: Normocephalic and atraumatic.  Eyes:     Conjunctiva/sclera: Conjunctivae normal.  Cardiovascular:     Rate and Rhythm: Normal rate and regular rhythm.  Pulmonary:     Effort: Pulmonary effort is normal. No respiratory distress.     Breath sounds: Wheezing present.     Comments: Very mild expiratory wheezing in the lung bases Abdominal:     General: There is no distension.     Palpations: Abdomen is soft.     Tenderness: There is no abdominal tenderness.  Skin:    General: Skin is warm and dry.  Neurological:     General: No focal deficit present.     Mental Status: She is alert.     ED  Results / Procedures / Treatments   Labs (all labs ordered are listed, but only abnormal results are displayed) Labs Reviewed  RESP PANEL BY RT-PCR (RSV, FLU A&B, COVID)  RVPGX2 - Abnormal; Notable for the following components:      Result Value   Resp Syncytial Virus by PCR POSITIVE (*)    All other components within normal limits    EKG None  Radiology No results found.  Procedures Procedures    Medications Ordered in ED Medications  albuterol (VENTOLIN HFA) 108 (90 Base) MCG/ACT inhaler 2 puff (2 puffs Inhalation Given 12/15/23 1955)    ED Course/ Medical Decision Making/ A&P                                 Medical Decision Making Risk Prescription drug management.  This patient is a 28 y.o. female who presents to the ED for concern  of cough, congestion, fever.   Differential diagnoses prior to evaluation: The emergent differential diagnosis includes, but is not limited to,  upper respiratory infection, lower respiratory infection, allergies, asthma, esophageal foreign body, interstitial lung disease, viral illness, sepsis. This is not an exhaustive differential.   Past Medical History / Co-morbidities / Additional history: Chart reviewed. Pertinent results include: asthma, anxiety, ADHD, reflux  Physical Exam: Physical exam performed. The pertinent findings include: Normal vitals, mild expiratory wheezing in the lung bases.   Lab Tests/Imaging studies: I personally interpreted labs/imaging and the pertinent results include:  respiratory panel positive for RSV.  Medications: I ordered medication including albuterol inhaler.  I have reviewed the patients home medicines and have made adjustments as needed.   Disposition: After consideration of the diagnostic results and the patients response to treatment, I feel that emergency department workup does not suggest an emergent condition requiring admission or immediate intervention beyond what has been performed at this time. Patient with symptoms consistent with RSV infection.  Vitals are stable.  No signs of dehydration, tolerating PO's.  Lungs are clear.   The plan is: Patient will be discharged with instructions to orally hydrate, rest, and use over-the-counter medications such as anti-inflammatories such as ibuprofen and Tylenol for fever.  Will send tessalon, zofran, prednisone, and antibiotics to treat asthma/COPD exacerbation in the setting of RSV with productive purulent sputum. The patient is safe for discharge and has been instructed to return immediately for worsening symptoms, change in symptoms or any other concerns.  Final Clinical Impression(s) / ED Diagnoses Final diagnoses:  RSV (respiratory syncytial virus infection)    Rx / DC Orders ED Discharge Orders           Ordered    benzonatate (TESSALON) 100 MG capsule  Every 8 hours        12/15/23 1950    predniSONE (DELTASONE) 50 MG tablet  Daily with breakfast        12/15/23 1950    azithromycin (ZITHROMAX) 250 MG tablet        12/15/23 1950    ondansetron (ZOFRAN) 4 MG tablet  Every 6 hours        12/15/23 2015           Portions of this report may have been transcribed using voice recognition software. Every effort was made to ensure accuracy; however, inadvertent computerized transcription errors may be present.    Jeanella Flattery 12/15/23 2018    Terrilee Files, MD 12/16/23 1014

## 2023-12-15 NOTE — ED Triage Notes (Signed)
 Pt is having flu-like symptoms and her asthma has been acting up. Using her inhaler.

## 2023-12-15 NOTE — Discharge Instructions (Addendum)
 You were seen in the emergency department today for flu like symptoms.  As we discussed your RSV test is positive.  This is a viral illness. Symptoms can last for up to a week.    Treatment is directed at relieving symptoms, you can use the following: Increased fluid intake (sports drinks offer valuable electrolytes, sugars, and fluids) Breathing heated mist or steam (vaporizer or shower).  Eating chicken soup or other clear broths, and maintaining good nutrition.  Getting plenty of rest.  Using gargles or lozenges for comfort  Return to work when your temperature has returned to normal.  Gargle warm salt water and spit it out for sore throat.   You may use up to 800 mg ibuprofen every 6 hours or 1000 mg of Tylenol every 6 hours.  You may choose to alternate between the 2.  This would be most effective.  Do not exceed 4000 mg of Tylenol within 24 hours.  Do not exceed 3200 mg ibuprofen within 24 hours.  Continue to monitor how you are doing, and return to the emergency department for new or worsening symptoms such as chest pain, difficulty breathing not related to coughing, fever despite medication, or persistent vomiting or diarrhea.  It has been a pleasure taking care of you today and I hope you begin to feel better soon!

## 2024-01-03 ENCOUNTER — Ambulatory Visit (HOSPITAL_COMMUNITY)
Admission: EM | Admit: 2024-01-03 | Discharge: 2024-01-03 | Discharge status: Against Medical Advice | Payer: Self-pay | Attending: Family Medicine | Admitting: Family Medicine

## 2024-01-03 NOTE — ED Notes (Signed)
 Informed by front desk staff that patient has left. Patient LWBS before triage.

## 2024-01-04 ENCOUNTER — Inpatient Hospital Stay (HOSPITAL_COMMUNITY)
Admission: AD | Admit: 2024-01-04 | Discharge: 2024-01-04 | Disposition: A | Discharge status: Home / Self Care | Payer: Self-pay | Attending: Obstetrics and Gynecology | Admitting: Obstetrics and Gynecology

## 2024-01-04 ENCOUNTER — Other Ambulatory Visit: Payer: Self-pay

## 2024-01-04 DIAGNOSIS — Z3202 Encounter for pregnancy test, result negative: Secondary | ICD-10-CM | POA: Insufficient documentation

## 2024-01-04 DIAGNOSIS — Z5329 Procedure and treatment not carried out because of patient's decision for other reasons: Secondary | ICD-10-CM | POA: Insufficient documentation

## 2024-01-04 LAB — POCT PREGNANCY, URINE: Preg Test, Ur: NEGATIVE

## 2024-01-04 NOTE — MAU Provider Note (Signed)
 History     [id number]  Arrival date and time: 01/04/24 1458    Chief Complaint  Patient presents with   Possible Pregnancy     HPI Regina Miller is a 28 y.o. who presents for positive home UPT.   Patient reports positive home UPT a few days ago Reports she took multiple ones that were positive Then took some and they were negative No cramping, but has had some spotting      OB History     Gravida  1   Para      Term      Preterm      AB      Living         SAB      IAB      Ectopic      Multiple      Live Births              Past Medical History:  Diagnosis Date   Acid reflux    Anxiety    Asthma    Attention deficit disorder    Heart murmur    Reports she had a heart murmur as a baby that has been evaluated. She is not currently followed by a cardiologist.    Past Surgical History:  Procedure Laterality Date   TONSILLECTOMY     TYMPANOSTOMY TUBE PLACEMENT Bilateral     Family History  Problem Relation Age of Onset   Psoriasis Mother    Hypertension Father    Eczema Father    Diabetes Father    Heart disease Paternal Grandmother    Cancer Maternal Grandmother     Social History   Socioeconomic History   Marital status: Single    Spouse name: Not on file   Number of children: Not on file   Years of education: Not on file   Highest education level: Not on file  Occupational History   Not on file  Tobacco Use   Smoking status: Former    Current packs/day: 1.00    Types: Cigarettes   Smokeless tobacco: Never   Tobacco comments:    Parents smoke around her  Vaping Use   Vaping status: Never Used  Substance and Sexual Activity   Alcohol use: Yes    Comment: occ   Drug use: Not Currently    Types: Marijuana   Sexual activity: Yes    Birth control/protection: None  Other Topics Concern   Not on file  Social History Narrative   Lives with father and step-mom. Mother lives in [place]. Has multiple siblings  through dad but none who live at home with her as they are all adults.   Social Drivers of Corporate investment banker Strain: Not on file  Food Insecurity: Not on file  Transportation Needs: Not on file  Physical Activity: Not on file  Stress: Not on file  Social Connections: Unknown (03/04/2022)   Received from [facility], [facility]   Social Network    Social Network: Not on file  Intimate Partner Violence: Not At Risk (12/15/2023)   Received from [facility]   HITS    Over the last 12 months how often did your partner physically hurt you?: Never    Over the last 12 months how often did your partner insult you or talk down to you?: Never    Over the last 12 months how often did your partner threaten you with physical harm?: Never  Over the last 12 months how often did your partner scream or curse at you?: Never    Allergies  Allergen Reactions   Kiwi Extract Nausea And Vomiting   Other Hives    Nair hair removal    No current facility-administered medications on file prior to encounter.   Current Outpatient Medications on File Prior to Encounter  Medication Sig Dispense Refill   azithromycin (ZITHROMAX) 250 MG tablet Take 2 tablets (500 mg) on day 1, after that take 1 tablet (250 mg) daily until complete. 6 tablet 0   benzonatate (TESSALON) 100 MG capsule Take 1 capsule (100 mg total) by mouth every 8 (eight) hours. 21 capsule 0   hydrOXYzine (ATARAX) 25 MG tablet Take 1-2 tablets (25-50 mg total) by mouth at bedtime as needed for anxiety. 20 tablet 0   ondansetron (ZOFRAN) 4 MG tablet Take 1 tablet (4 mg total) by mouth every 6 (six) hours. 12 tablet 0   [DISCONTINUED] diphenhydrAMINE (BENADRYL) 25 mg capsule Take 1 capsule (25 mg total) by mouth every 8 (eight) hours as needed for allergies. 30 capsule 0   [DISCONTINUED] loratadine (CLARITIN) 10 MG tablet Take 1 tablet (10 mg total) by mouth daily. 30 tablet 0     ROS Pertinent positives and negative per HPI,  all others reviewed and negative  Physical Exam   BP 113/65 (BP Location: Right Arm)   Pulse 71   Temp 97.9 F (36.6 C) (Oral)   Resp 18   Ht 5\' 1"  (1.549 m)   Wt 106.7 kg   LMP  (LMP Unknown)   SpO2 100%   BMI 44.44 kg/m   Patient Vitals for the past 24 hrs:  BP Temp Temp src Pulse Resp SpO2 Height Weight  01/04/24 1515 113/65 97.9 F (36.6 C) Oral 71 18 100 % -- --  01/04/24 1511 -- -- -- -- -- -- 5\' 1"  (1.549 m) 106.7 kg    Physical Exam Vitals reviewed.  Constitutional:      General: She is not in acute distress.    Appearance: She is well-developed. She is not diaphoretic.  Eyes:     General: No scleral icterus. Pulmonary:     Effort: Pulmonary effort is normal. No respiratory distress.  Skin:    General: Skin is warm and dry.  Neurological:     Mental Status: She is alert.     Coordination: Coordination normal.      Cervical Exam    Bedside Ultrasound Not performed.  My interpretation: n/a   Labs Results for orders placed or performed during the hospital encounter of 01/04/24 (from the past 24 hours)  Pregnancy, urine POC     Status: None   Collection Time: 01/04/24  3:28 PM  Result Value Ref Range   Preg Test, Ur NEGATIVE NEGATIVE    Imaging No results found.  MAU Course  Procedures  Lab Orders         hCG, quantitative, pregnancy         Pregnancy, urine POC    No orders of the defined types were placed in this encounter.  Imaging Orders  No imaging studies ordered today    MDM Low (Level 2)  Assessment and Plan  # Negative urine pregnancy test UPT here negative. Discussed collecting serum hcg and then following up by phone. Patient amenable to plan, however when phlebotomy came to find patient she had left AMA.   Dispo: left AMA    Venora Maples, MD/MPH 01/04/24  4:19 PM

## 2024-01-04 NOTE — Progress Notes (Signed)
 Phlebotomy called pt from lobby twice to draw blood work, pt no longer there.  RN looked for patient as well, pt not seen and not in lobby. Pt left prior to blood being drawn.

## 2024-01-04 NOTE — MAU Note (Addendum)
 Regina Miller is a 28 y.o. at Unknown here in MAU reporting: she's had both positive and negative UPT's, unsure if she's pregnant or not wants confirmation.   Reports intermittent spotting with wiping, denies pain.  LMP: Unsure Onset of complaint: today Pain score: 0 Vitals:   01/04/24 1515  BP: 113/65  Pulse: 71  Resp: 18  Temp: 97.9 F (36.6 C)  SpO2: 100%     FHT: NA  Lab orders placed from triage:  UPT

## 2024-01-06 ENCOUNTER — Emergency Department (HOSPITAL_BASED_OUTPATIENT_CLINIC_OR_DEPARTMENT_OTHER)
Admission: EM | Admit: 2024-01-06 | Discharge: 2024-01-06 | Disposition: A | Discharge status: Home / Self Care | Payer: Self-pay | Attending: Student | Admitting: Student

## 2024-01-06 ENCOUNTER — Other Ambulatory Visit: Payer: Self-pay

## 2024-01-06 ENCOUNTER — Encounter (HOSPITAL_BASED_OUTPATIENT_CLINIC_OR_DEPARTMENT_OTHER): Payer: Self-pay

## 2024-01-06 ENCOUNTER — Ambulatory Visit (HOSPITAL_COMMUNITY): Payer: Self-pay

## 2024-01-06 DIAGNOSIS — Z87891 Personal history of nicotine dependence: Secondary | ICD-10-CM | POA: Insufficient documentation

## 2024-01-06 DIAGNOSIS — R197 Diarrhea, unspecified: Secondary | ICD-10-CM | POA: Insufficient documentation

## 2024-01-06 DIAGNOSIS — J45909 Unspecified asthma, uncomplicated: Secondary | ICD-10-CM | POA: Insufficient documentation

## 2024-01-06 LAB — URINALYSIS, ROUTINE W REFLEX MICROSCOPIC
Bilirubin Urine: NEGATIVE
Glucose, UA: NEGATIVE mg/dL
Ketones, ur: NEGATIVE mg/dL
Leukocytes,Ua: NEGATIVE
Nitrite: NEGATIVE
Protein, ur: 30 mg/dL — AB
Specific Gravity, Urine: 1.04 — ABNORMAL HIGH (ref 1.005–1.030)
pH: 6 (ref 5.0–8.0)

## 2024-01-06 LAB — COMPREHENSIVE METABOLIC PANEL
ALT: 18 U/L (ref 0–44)
AST: 11 U/L — ABNORMAL LOW (ref 15–41)
Albumin: 4.4 g/dL (ref 3.5–5.0)
Alkaline Phosphatase: 79 U/L (ref 38–126)
Anion gap: 7 (ref 5–15)
BUN: 14 mg/dL (ref 6–20)
CO2: 25 mmol/L (ref 22–32)
Calcium: 9.2 mg/dL (ref 8.9–10.3)
Chloride: 106 mmol/L (ref 98–111)
Creatinine, Ser: 0.84 mg/dL (ref 0.44–1.00)
GFR, Estimated: >60 mL/min (ref >60)
Glucose, Bld: 99 mg/dL (ref 70–99)
Potassium: 3.6 mmol/L (ref 3.5–5.1)
Sodium: 138 mmol/L (ref 135–145)
Total Bilirubin: 0.3 mg/dL (ref 0.0–1.2)
Total Protein: 6.7 g/dL (ref 6.5–8.1)

## 2024-01-06 LAB — CBC
HCT: 41.4 % (ref 36.0–46.0)
Hemoglobin: 13.9 g/dL (ref 12.0–15.0)
MCH: 32.1 pg (ref 26.0–34.0)
MCHC: 33.6 g/dL (ref 30.0–36.0)
MCV: 95.6 fL (ref 80.0–100.0)
Platelets: 359 10*3/uL (ref 150–400)
RBC: 4.33 MIL/uL (ref 3.87–5.11)
RDW: 12.6 % (ref 11.5–15.5)
WBC: 10.3 10*3/uL (ref 4.0–10.5)
nRBC: 0 % (ref 0.0–0.2)

## 2024-01-06 LAB — C DIFFICILE QUICK SCREEN W PCR REFLEX
C Diff antigen: NEGATIVE
C Diff interpretation: NOT DETECTED
C Diff toxin: NEGATIVE

## 2024-01-06 LAB — LIPASE, BLOOD: Lipase: 14 U/L (ref 11–51)

## 2024-01-06 LAB — PREGNANCY, URINE: Preg Test, Ur: NEGATIVE

## 2024-01-06 MED ORDER — LOPERAMIDE HCL 2 MG PO CAPS: 2.0000 mg | ORAL_CAPSULE | Freq: Four times a day (QID) | ORAL | 0 refills | Status: DC | PRN

## 2024-01-06 MED ORDER — ONDANSETRON 4 MG PO TBDP: 4.0000 mg | ORAL_TABLET | Freq: Three times a day (TID) | ORAL | 0 refills | Status: DC | PRN

## 2024-01-06 NOTE — ED Provider Notes (Signed)
 [place] EMERGENCY DEPARTMENT AT [facility] Provider Note  CSN: [id number] Arrival date & time: 01/06/24 0551  Chief Complaint(s) Diarrhea  HPI Regina Miller is a 28 y.o. female with PMH general anxiety disorder, PTSD, BPD who presents emergency room for evaluation of diarrhea.  States that 4 days ago she had [employer] and 30 minutes later started to have vomiting and diarrhea.  The vomiting has resolved but she has persistent nausea and frequent episodes of watery diarrhea.  Denies associate abdominal pain, chest pain, shortness of breath, headache, fever or other systemic symptoms.  Diarrhea is watery and nonbloody.   Past Medical History Past Medical History:  Diagnosis Date   Acid reflux    Anxiety    Asthma    Attention deficit disorder    Heart murmur    Reports she had a heart murmur as a baby that has been evaluated. She is not currently followed by a cardiologist.   Patient Active Problem List   Diagnosis Date Noted   GAD (generalized anxiety disorder) 02/12/2023   PTSD (post-traumatic stress disorder) 02/03/2023   Borderline personality disorder (HCC) 02/03/2023   Morbid obesity (HCC) 07/19/2019   First trimester pregnancy 07/26/2017   Anxiety 12/22/2013   Eczema 12/22/2013   Body mass index, pediatric, greater than or 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 age 73/02/2014   Acne 04/26/2012   Insomnia 07/06/2011   GERD 09/19/2008   Attention deficit hyperactivity disorder (ADHD) 12/17/2006   Home Medication(s) Prior to Admission medications   Medication Sig Start Date End Date Taking? Authorizing Provider  loperamide (IMODIUM) 2 MG capsule Take 1 capsule (2 mg total) by mouth 4 (four) times daily as needed for diarrhea or loose stools. 01/06/24  Yes Evea Sheek, MD  ondansetron (ZOFRAN-ODT) 4 MG disintegrating tablet Take 1 tablet (4 mg total) by mouth every 8 (eight) hours as needed for nausea or vomiting. 01/06/24  Yes Epic Tribbett, MD  azithromycin  (ZITHROMAX) 250 MG tablet Take 2 tablets (500 mg) on day 1, after that take 1 tablet (250 mg) daily until complete. 12/15/23   Roemhildt, Lorin T, PA-C  benzonatate (TESSALON) 100 MG capsule Take 1 capsule (100 mg total) by mouth every 8 (eight) hours. 12/15/23   Roemhildt, Lorin T, PA-C  hydrOXYzine (ATARAX) 25 MG tablet Take 1-2 tablets (25-50 mg total) by mouth at bedtime as needed for anxiety. 06/16/23   Raspet, Erin K, PA-C  ondansetron (ZOFRAN) 4 MG tablet Take 1 tablet (4 mg total) by mouth every 6 (six) hours. 12/15/23   Roemhildt, Lorin T, PA-C  diphenhydrAMINE (BENADRYL) 25 mg capsule Take 1 capsule (25 mg total) by mouth every 8 (eight) hours as needed for allergies. 07/19/19 10/18/19  Betancourt, Jarold Song, NP  loratadine (CLARITIN) 10 MG tablet Take 1 tablet (10 mg total) by mouth daily. 07/19/19 10/18/19  Betancourt, Jarold Song, NP  Past Surgical History Past Surgical History:  Procedure Laterality Date   TONSILLECTOMY     TYMPANOSTOMY TUBE PLACEMENT Bilateral    Family History Family History  Problem Relation Age of Onset   Psoriasis Mother    Hypertension Father    Eczema Father    Diabetes Father    Heart disease Paternal Grandmother    Cancer Maternal Grandmother     Social History Social History   Tobacco Use   Smoking status: Former    Current packs/day: 1.00    Types: Cigarettes   Smokeless tobacco: Never   Tobacco comments:    Parents smoke around her  Vaping Use   Vaping status: Never Used  Substance Use Topics   Alcohol use: Yes    Comment: occ   Drug use: Not Currently    Types: Marijuana   Allergies Kiwi extract and Other  Review of Systems Review of Systems  Gastrointestinal:  Positive for diarrhea, nausea and vomiting.    Physical Exam Vital Signs  I have reviewed the triage vital signs BP 109/69   Pulse 68   Temp 98 F  (36.7 C)   Resp 18   Ht 5\' 1"  (1.549 m)   Wt 106.6 kg   LMP  (LMP Unknown)   SpO2 99%   BMI 44.40 kg/m   Physical Exam Vitals and nursing note reviewed.  Constitutional:      General: She is not in acute distress.    Appearance: She is well-developed.  HENT:     Head: Normocephalic and atraumatic.  Eyes:     Conjunctiva/sclera: Conjunctivae normal.  Cardiovascular:     Rate and Rhythm: Normal rate and regular rhythm.     Heart sounds: No murmur heard. Pulmonary:     Effort: Pulmonary effort is normal. No respiratory distress.     Breath sounds: Normal breath sounds.  Abdominal:     Palpations: Abdomen is soft.     Tenderness: There is no abdominal tenderness.  Musculoskeletal:        General: No swelling.     Cervical back: Neck supple.  Skin:    General: Skin is warm and dry.     Capillary Refill: Capillary refill takes less than 2 seconds.  Neurological:     Mental Status: She is alert.  Psychiatric:        Mood and Affect: Mood normal.     ED Results and Treatments Labs (all labs ordered are listed, but only abnormal results are displayed) Labs Reviewed  COMPREHENSIVE METABOLIC PANEL - Abnormal; Notable for the following components:      Result Value   AST 11 (*)    All other components within normal limits  URINALYSIS, ROUTINE W REFLEX MICROSCOPIC - Abnormal; Notable for the following components:   Specific Gravity, Urine 1.040 (*)    Hgb urine dipstick MODERATE (*)    Protein, ur 30 (*)    Bacteria, UA RARE (*)    Crystals PRESENT (*)    All other components within normal limits  GASTROINTESTINAL PANEL BY PCR, STOOL (REPLACES STOOL CULTURE)  C DIFFICILE QUICK SCREEN W PCR REFLEX    LIPASE, BLOOD  CBC  PREGNANCY, URINE  Radiology No results found.  Pertinent labs & imaging results that were available during my care of the patient  were reviewed by me and considered in my medical decision making (see MDM for details).  Medications Ordered in ED Medications - No data to display                                                                                                                                   Procedures Procedures  (including critical care time)  Medical Decision Making / ED Course   This patient presents to the ED for concern of diarrhea, this involves an extensive number of treatment options, and is a complaint that carries with it a high risk of complications and morbidity.  The differential diagnosis includes staphylococcal food poisoning, infectious diarrhea, secretory diarrhea, mild absorption, electrolyte abnormality, dehydration  MDM: Patient seen emergency room for evaluation of diarrhea.  Physical exam is largely unremarkable.  Laboratory evaluation with no electrolyte abnormalities and is otherwise unremarkable.  Pregnancy negative.  Presentation does appear consistent with food poisoning and we will send stool studies today and call the patient with results.  Will be discharged with Zofran and Imodium.  At this time patient is hemodynamically stable and does not meet inpatient criteria for admission.  Will be discharged with outpatient follow-up and return precautions of which she voiced understanding.   Additional history obtained:  -External records from outside source obtained and reviewed including: Chart review including previous notes, labs, imaging, consultation notes   Lab Tests: -I ordered, reviewed, and interpreted labs.   The pertinent results include:   Labs Reviewed  COMPREHENSIVE METABOLIC PANEL - Abnormal; Notable for the following components:      Result Value   AST 11 (*)    All other components within normal limits  URINALYSIS, ROUTINE W REFLEX MICROSCOPIC - Abnormal; Notable for the following components:   Specific Gravity, Urine 1.040 (*)    Hgb urine dipstick  MODERATE (*)    Protein, ur 30 (*)    Bacteria, UA RARE (*)    Crystals PRESENT (*)    All other components within normal limits  GASTROINTESTINAL PANEL BY PCR, STOOL (REPLACES STOOL CULTURE)  C DIFFICILE QUICK SCREEN W PCR REFLEX    LIPASE, BLOOD  CBC  PREGNANCY, URINE     Medicines ordered and prescription drug management: Meds ordered this encounter  Medications   ondansetron (ZOFRAN-ODT) 4 MG disintegrating tablet    Sig: Take 1 tablet (4 mg total) by mouth every 8 (eight) hours as needed for nausea or vomiting.    Dispense:  20 tablet    Refill:  0   loperamide (IMODIUM) 2 MG capsule    Sig: Take 1 capsule (2 mg total) by mouth 4 (four) times daily as needed for diarrhea or loose stools.    Dispense:  12 capsule    Refill:  0    -I have  reviewed the patients home medicines and have made adjustments as needed  Critical interventions none   Social Determinants of Health:  Factors impacting patients care include: none   Reevaluation: After the interventions noted above, I reevaluated the patient and found that they have :stayed the same  Co morbidities that complicate the patient evaluation  Past Medical History:  Diagnosis Date   Acid reflux    Anxiety    Asthma    Attention deficit disorder    Heart murmur    Reports she had a heart murmur as a baby that has been evaluated. She is not currently followed by a cardiologist.      Dispostion: I considered admission for this patient, but at this time she does not meet inpatient criteria for admission and will be discharged with outpatient follow-up     Final Clinical Impression(s) / ED Diagnoses Final diagnoses:  Diarrhea of presumed infectious origin     @PCDICTATION @    Glendora Score, MD 01/06/24 [phone]

## 2024-01-06 NOTE — ED Triage Notes (Signed)
 Nausea and vomiting 4 days ago followed with diarrhea since then.   Says she had eaten at [employer] just prior to symptoms manifesting and suspects food poisoning.

## 2024-01-07 LAB — GASTROINTESTINAL PANEL BY PCR, STOOL (REPLACES STOOL CULTURE)

## 2024-01-21 ENCOUNTER — Emergency Department (HOSPITAL_BASED_OUTPATIENT_CLINIC_OR_DEPARTMENT_OTHER): Payer: Self-pay

## 2024-01-21 ENCOUNTER — Encounter (HOSPITAL_BASED_OUTPATIENT_CLINIC_OR_DEPARTMENT_OTHER): Payer: Self-pay | Admitting: *Deleted

## 2024-01-21 ENCOUNTER — Emergency Department (HOSPITAL_BASED_OUTPATIENT_CLINIC_OR_DEPARTMENT_OTHER)
Admission: EM | Admit: 2024-01-21 | Discharge: 2024-01-21 | Disposition: A | Discharge status: Home / Self Care | Payer: Self-pay | Attending: Emergency Medicine | Admitting: Emergency Medicine

## 2024-01-21 ENCOUNTER — Other Ambulatory Visit: Payer: Self-pay

## 2024-01-21 DIAGNOSIS — J45909 Unspecified asthma, uncomplicated: Secondary | ICD-10-CM | POA: Insufficient documentation

## 2024-01-21 DIAGNOSIS — A084 Viral intestinal infection, unspecified: Secondary | ICD-10-CM | POA: Diagnosis not present

## 2024-01-21 DIAGNOSIS — K219 Gastro-esophageal reflux disease without esophagitis: Secondary | ICD-10-CM | POA: Insufficient documentation

## 2024-01-21 DIAGNOSIS — R112 Nausea with vomiting, unspecified: Secondary | ICD-10-CM | POA: Diagnosis present

## 2024-01-21 LAB — COMPREHENSIVE METABOLIC PANEL WITH GFR
ALT: 17 U/L (ref 0–44)
AST: 14 U/L — ABNORMAL LOW (ref 15–41)
Albumin: 3.8 g/dL (ref 3.5–5.0)
Alkaline Phosphatase: 65 U/L (ref 38–126)
Anion gap: 9 (ref 5–15)
BUN: 13 mg/dL (ref 6–20)
CO2: 24 mmol/L (ref 22–32)
Calcium: 9 mg/dL (ref 8.9–10.3)
Chloride: 104 mmol/L (ref 98–111)
Creatinine, Ser: 1.01 mg/dL — ABNORMAL HIGH (ref 0.44–1.00)
GFR, Estimated: >60 mL/min (ref >60)
Glucose, Bld: 92 mg/dL (ref 70–99)
Potassium: 3.8 mmol/L (ref 3.5–5.1)
Sodium: 137 mmol/L (ref 135–145)
Total Bilirubin: 0.6 mg/dL (ref 0.0–1.2)
Total Protein: 7.1 g/dL (ref 6.5–8.1)

## 2024-01-21 LAB — CBC WITH DIFFERENTIAL/PLATELET
Abs Immature Granulocytes: 0.02 10*3/uL (ref 0.00–0.07)
Basophils Absolute: 0 10*3/uL (ref 0.0–0.1)
Basophils Relative: 0 %
Eosinophils Absolute: 0.1 10*3/uL (ref 0.0–0.5)
Eosinophils Relative: 1 %
HCT: 39.7 % (ref 36.0–46.0)
Hemoglobin: 13.4 g/dL (ref 12.0–15.0)
Immature Granulocytes: 0 %
Lymphocytes Relative: 24 %
Lymphs Abs: 1.6 10*3/uL (ref 0.7–4.0)
MCH: 32.2 pg (ref 26.0–34.0)
MCHC: 33.8 g/dL (ref 30.0–36.0)
MCV: 95.4 fL (ref 80.0–100.0)
Monocytes Absolute: 0.6 10*3/uL (ref 0.1–1.0)
Monocytes Relative: 8 %
Neutro Abs: 4.6 10*3/uL (ref 1.7–7.7)
Neutrophils Relative %: 67 %
Platelets: 349 10*3/uL (ref 150–400)
RBC: 4.16 MIL/uL (ref 3.87–5.11)
RDW: 12.4 % (ref 11.5–15.5)
WBC: 6.9 10*3/uL (ref 4.0–10.5)
nRBC: 0 % (ref 0.0–0.2)

## 2024-01-21 LAB — RAPID URINE DRUG SCREEN, HOSP PERFORMED
Amphetamines: NOT DETECTED
Barbiturates: NOT DETECTED
Benzodiazepines: NOT DETECTED
Cocaine: NOT DETECTED
Opiates: NOT DETECTED
Tetrahydrocannabinol: NOT DETECTED

## 2024-01-21 LAB — URINALYSIS, ROUTINE W REFLEX MICROSCOPIC
Bilirubin Urine: NEGATIVE
Glucose, UA: NEGATIVE mg/dL
Ketones, ur: NEGATIVE mg/dL
Nitrite: NEGATIVE
Protein, ur: NEGATIVE mg/dL
Specific Gravity, Urine: 1.02 (ref 1.005–1.030)
pH: 6 (ref 5.0–8.0)

## 2024-01-21 LAB — LIPASE, BLOOD: Lipase: 19 U/L (ref 11–51)

## 2024-01-21 LAB — URINALYSIS, MICROSCOPIC (REFLEX)

## 2024-01-21 LAB — PREGNANCY, URINE: Preg Test, Ur: NEGATIVE

## 2024-01-21 MED ORDER — ONDANSETRON HCL 4 MG/2ML IJ SOLN
4.0000 mg | Freq: Once | INTRAMUSCULAR | Status: AC
  Administered 2024-01-21: 4 mg via INTRAVENOUS
  Filled 2024-01-21: qty 2

## 2024-01-21 MED ORDER — PANTOPRAZOLE SODIUM 40 MG PO TBEC: 40.0000 mg | DELAYED_RELEASE_TABLET | Freq: Every day | ORAL | 1 refills | Status: DC

## 2024-01-21 MED ORDER — ONDANSETRON 4 MG PO TBDP: 4.0000 mg | ORAL_TABLET | Freq: Three times a day (TID) | ORAL | 0 refills | Status: AC | PRN

## 2024-01-21 MED ORDER — IOHEXOL 300 MG/ML  SOLN
100.0000 mL | Freq: Once | INTRAMUSCULAR | Status: AC | PRN
  Administered 2024-01-21: 100 mL via INTRAVENOUS

## 2024-01-21 MED ORDER — DICYCLOMINE HCL 20 MG PO TABS: 20.0000 mg | ORAL_TABLET | Freq: Two times a day (BID) | ORAL | 0 refills | Status: DC

## 2024-01-21 MED ORDER — MORPHINE SULFATE (PF) 4 MG/ML IV SOLN
4.0000 mg | Freq: Once | INTRAVENOUS | Status: AC
  Administered 2024-01-21: 4 mg via INTRAVENOUS
  Filled 2024-01-21: qty 1

## 2024-01-21 MED ORDER — PANTOPRAZOLE SODIUM 40 MG IV SOLR
40.0000 mg | Freq: Once | INTRAVENOUS | Status: AC
  Administered 2024-01-21: 40 mg via INTRAVENOUS
  Filled 2024-01-21: qty 10

## 2024-01-21 MED ORDER — SODIUM CHLORIDE 0.9 % IV BOLUS
1000.0000 mL | Freq: Once | INTRAVENOUS | Status: AC
  Administered 2024-01-21: 1000 mL via INTRAVENOUS

## 2024-01-21 NOTE — ED Notes (Signed)
 Sent pt. For Urine Sample

## 2024-01-21 NOTE — Discharge Instructions (Addendum)
 Your workup today was reassuring.  No concerning findings on your CT scan.  It did show incidental finding in your liver.  This will need additional workup.  Please follow-up with your primary care provider.  Have listed gastroenterologist as well as a primary care doctor you can establish with.  Pressure to follow-up with a primary care doctor.  If you are unable to get in with them or is taking so long can also try the gastroenterologist.  I have sent in a few medications into the pharmacy including Protonix for the acid reflux, Zofran for nausea, and Bentyl for pain.  Return for any emergent symptoms.

## 2024-01-21 NOTE — ED Triage Notes (Signed)
 Pt is here for reevaluation of nausea for 4 days pt states that she vomited 9x yesterday but this is now resolved.  Pt is here for reevaluation due to continued nausea.

## 2024-01-21 NOTE — ED Provider Notes (Signed)
 [place] [facility] Provider Note   CSN: [id number] Arrival date & time: 01/21/24  1021     History  Chief Complaint  Patient presents with   Nausea    Regina Miller is a 28 y.o. female.  28 year old female presents today for concern of epigastric abdominal pain with associated nausea and vomiting.  She states this started 4 days ago after she had some sherbet.  She was seen at the emergency department yesterday and was told she had food poisoning.  However she states she is having this burning sensation which she does not feel is consistent with a GI bug.  She does have history of acid reflux.  Denies routine NSAID use.  Denies hematemesis, or melanotic stools.  No bright red blood per rectum.  The history is provided by the patient. No language interpreter was used.       Home Medications Prior to Admission medications   Medication Sig Start Date End Date Taking? Authorizing Provider  azithromycin (ZITHROMAX) 250 MG tablet Take 2 tablets (500 mg) on day 1, after that take 1 tablet (250 mg) daily until complete. 12/15/23   Roemhildt, Lorin T, PA-C  benzonatate (TESSALON) 100 MG capsule Take 1 capsule (100 mg total) by mouth every 8 (eight) hours. 12/15/23   Roemhildt, Lorin T, PA-C  hydrOXYzine (ATARAX) 25 MG tablet Take 1-2 tablets (25-50 mg total) by mouth at bedtime as needed for anxiety. 06/16/23   Raspet, Noberto Retort, PA-C  loperamide (IMODIUM) 2 MG capsule Take 1 capsule (2 mg total) by mouth 4 (four) times daily as needed for diarrhea or loose stools. 01/06/24   Kommor, Madison, MD  ondansetron (ZOFRAN) 4 MG tablet Take 1 tablet (4 mg total) by mouth every 6 (six) hours. 12/15/23   Roemhildt, Lorin T, PA-C  ondansetron (ZOFRAN-ODT) 4 MG disintegrating tablet Take 1 tablet (4 mg total) by mouth every 8 (eight) hours as needed for nausea or vomiting. 01/06/24   Kommor, Madison, MD  diphenhydrAMINE (BENADRYL) 25 mg capsule Take 1 capsule (25 mg  total) by mouth every 8 (eight) hours as needed for allergies. 07/19/19 10/18/19  Betancourt, Jarold Song, NP  loratadine (CLARITIN) 10 MG tablet Take 1 tablet (10 mg total) by mouth daily. 07/19/19 10/18/19  Betancourt, Jarold Song, NP      Allergies    Kiwi extract and Other    Review of Systems   Review of Systems  Constitutional:  Negative for activity change, chills and fever.  Respiratory:  Negative for shortness of breath.   Cardiovascular:  Negative for chest pain.  Gastrointestinal:  Positive for abdominal pain, nausea and vomiting. Negative for abdominal distention and blood in stool.  Genitourinary:  Negative for dysuria.  Neurological:  Negative for weakness and light-headedness.  All other systems reviewed and are negative.   Physical Exam Updated Vital Signs BP 107/67   Pulse 84   Temp 98.2 F (36.8 C) (Oral)   Resp 16   LMP 01/18/2024   SpO2 98%  Physical Exam Vitals and nursing note reviewed.  Constitutional:      General: She is not in acute distress.    Appearance: Normal appearance. She is not ill-appearing.  HENT:     Head: Normocephalic and atraumatic.     Nose: Nose normal.  Eyes:     General: No scleral icterus.    Extraocular Movements: Extraocular movements intact.     Conjunctiva/sclera: Conjunctivae normal.  Cardiovascular:  Rate and Rhythm: Normal rate and regular rhythm.     Pulses: Normal pulses.     Heart sounds: Normal heart sounds.  Pulmonary:     Effort: Pulmonary effort is normal. No respiratory distress.     Breath sounds: Normal breath sounds. No wheezing or rales.  Abdominal:     General: There is no distension.     Tenderness: There is abdominal tenderness (Epigastric region). There is no right CVA tenderness, left CVA tenderness or guarding.  Musculoskeletal:        General: Normal range of motion.     Cervical back: Normal range of motion.  Skin:    General: Skin is warm and dry.  Neurological:     General: No focal deficit  present.     Mental Status: She is alert. Mental status is at baseline.     ED Results / Procedures / Treatments   Labs (all labs ordered are listed, but only abnormal results are displayed) Labs Reviewed  CBC WITH DIFFERENTIAL/PLATELET  COMPREHENSIVE METABOLIC PANEL WITH GFR  LIPASE, BLOOD  URINALYSIS, ROUTINE W REFLEX MICROSCOPIC  RAPID URINE DRUG SCREEN, HOSP PERFORMED    EKG None  Radiology No results found.  Procedures Procedures    Medications Ordered in ED Medications  ondansetron (ZOFRAN) injection 4 mg (has no administration in time range)  morphine (PF) 4 MG/ML injection 4 mg (has no administration in time range)  sodium chloride 0.9 % bolus 1,000 mL (has no administration in time range)  pantoprazole (PROTONIX) injection 40 mg (has no administration in time range)    ED Course/ Medical Decision Making/ A&P                                 Medical Decision Making Amount and/or Complexity of Data Reviewed Labs: ordered. Radiology: ordered.  Risk Prescription drug management.   Medical Decision Making / ED Course   This patient presents to the ED for concern of abdominal pain, vomiting, this involves an extensive number of treatment options, and is a complaint that carries with it a high risk of complications and morbidity.  The differential diagnosis includes gastritis, pancreatitis, appendicitis, cholecystitis, colitis, diverticulitis, gastroenteritis  MDM: 28 year old female presents today for concern of above-mentioned symptoms.  She is overall well-appearing.  Hemodynamically stable.  Tenderness over the epigastric region.  No guarding or distention.  CBC without leukocytosis or anemia.  CMP with preserved renal function, normal electrolytes, normal LFTs.  UA without evidence of UTI.  UDS unremarkable, UA with some hemoglobin otherwise no signs of a urinary tract infection.  Pregnancy test negative.  Lipase within normal.  CT abdomen pelvis with  contrast obtained.  No acute intra-abdominal or pelvic finding.  Does show incidental finding of hyperattenuating structures in the liver.  Patient notified of this.  She will establish and follow-up with a PCP.  Gastroenterology information given as well.  Patient is in agreement with plan.  Discharged in stable condition.   Additional history obtained: -Additional history obtained from outside ED visit chart reviewed -External records from outside source obtained and reviewed including: Chart review including previous notes, labs, imaging, consultation notes   Lab Tests: -I ordered, reviewed, and interpreted labs.   The pertinent results include:   Labs Reviewed  COMPREHENSIVE METABOLIC PANEL WITH GFR - Abnormal; Notable for the following components:      Result Value   Creatinine, Ser 1.01 (*)  AST 14 (*)    All other components within normal limits  URINALYSIS, ROUTINE W REFLEX MICROSCOPIC - Abnormal; Notable for the following components:   APPearance HAZY (*)    Hgb urine dipstick MODERATE (*)    Leukocytes,Ua SMALL (*)    All other components within normal limits  URINALYSIS, MICROSCOPIC (REFLEX) - Abnormal; Notable for the following components:   Bacteria, UA FEW (*)    All other components within normal limits  CBC WITH DIFFERENTIAL/PLATELET  LIPASE, BLOOD  RAPID URINE DRUG SCREEN, HOSP PERFORMED  PREGNANCY, URINE      EKG  EKG Interpretation Date/Time:    Ventricular Rate:    PR Interval:    QRS Duration:    QT Interval:    QTC Calculation:   R Axis:      Text Interpretation:           Imaging Studies ordered: I ordered imaging studies including CT abdomen pelvis with contrast I independently visualized and interpreted imaging. I agree with the radiologist interpretation   Medicines ordered and prescription drug management: Meds ordered this encounter  Medications   ondansetron (ZOFRAN) injection 4 mg   morphine (PF) 4 MG/ML injection 4 mg    sodium chloride 0.9 % bolus 1,000 mL   pantoprazole (PROTONIX) injection 40 mg   iohexol (OMNIPAQUE) 300 MG/ML solution 100 mL    -I have reviewed the patients home medicines and have made adjustments as needed   Reevaluation: After the interventions noted above, I reevaluated the patient and found that they have :improved no episodes of emesis in the emergency department.  Co morbidities that complicate the patient evaluation  Past Medical History:  Diagnosis Date   Acid reflux    Anxiety    Asthma    Attention deficit disorder    Heart murmur    Reports she had a heart murmur as a baby that has been evaluated. She is not currently followed by a cardiologist.      Dispostion: Discharged in stable condition.  Return precaution discussed.  Patient voices understanding and is in agreement with plan.   Final Clinical Impression(s) / ED Diagnoses Final diagnoses:  Viral gastroenteritis  Gastroesophageal reflux disease, unspecified whether esophagitis present    Rx / DC Orders ED Discharge Orders          Ordered    ondansetron (ZOFRAN-ODT) 4 MG disintegrating tablet  Every 8 hours PRN        01/21/24 1436    dicyclomine (BENTYL) 20 MG tablet  2 times daily        01/21/24 1436    pantoprazole (PROTONIX) 40 MG tablet  Daily        01/21/24 1436              Marita Kansas, PA-C 01/21/24 1436    Elayne Snare K, DO 01/21/24 1527

## 2024-01-22 ENCOUNTER — Telehealth: Payer: Self-pay | Admitting: General Practice

## 2024-01-22 ENCOUNTER — Emergency Department (HOSPITAL_BASED_OUTPATIENT_CLINIC_OR_DEPARTMENT_OTHER)
Admission: EM | Admit: 2024-01-22 | Discharge: 2024-01-22 | Disposition: A | Discharge status: Home / Self Care | Payer: Self-pay | Attending: Emergency Medicine | Admitting: Emergency Medicine

## 2024-01-22 DIAGNOSIS — R110 Nausea: Secondary | ICD-10-CM

## 2024-01-22 DIAGNOSIS — R112 Nausea with vomiting, unspecified: Secondary | ICD-10-CM | POA: Insufficient documentation

## 2024-01-22 DIAGNOSIS — J45909 Unspecified asthma, uncomplicated: Secondary | ICD-10-CM | POA: Diagnosis not present

## 2024-01-22 LAB — COMPREHENSIVE METABOLIC PANEL WITH GFR
ALT: 16 U/L (ref 0–44)
AST: 15 U/L (ref 15–41)
Albumin: 3.8 g/dL (ref 3.5–5.0)
Alkaline Phosphatase: 64 U/L (ref 38–126)
Anion gap: 8 (ref 5–15)
BUN: 12 mg/dL (ref 6–20)
CO2: 24 mmol/L (ref 22–32)
Calcium: 9.1 mg/dL (ref 8.9–10.3)
Chloride: 105 mmol/L (ref 98–111)
Creatinine, Ser: 1.07 mg/dL — ABNORMAL HIGH (ref 0.44–1.00)
GFR, Estimated: >60 mL/min (ref >60)
Glucose, Bld: 104 mg/dL — ABNORMAL HIGH (ref 70–99)
Potassium: 3.7 mmol/L (ref 3.5–5.1)
Sodium: 137 mmol/L (ref 135–145)
Total Bilirubin: 0.6 mg/dL (ref 0.0–1.2)
Total Protein: 7.2 g/dL (ref 6.5–8.1)

## 2024-01-22 LAB — CBC WITH DIFFERENTIAL/PLATELET
Abs Immature Granulocytes: 0.04 10*3/uL (ref 0.00–0.07)
Basophils Absolute: 0 10*3/uL (ref 0.0–0.1)
Basophils Relative: 0 %
Eosinophils Absolute: 0.1 10*3/uL (ref 0.0–0.5)
Eosinophils Relative: 1 %
HCT: 37.4 % (ref 36.0–46.0)
Hemoglobin: 12.8 g/dL (ref 12.0–15.0)
Immature Granulocytes: 0 %
Lymphocytes Relative: 21 %
Lymphs Abs: 2 10*3/uL (ref 0.7–4.0)
MCH: 32.4 pg (ref 26.0–34.0)
MCHC: 34.2 g/dL (ref 30.0–36.0)
MCV: 94.7 fL (ref 80.0–100.0)
Monocytes Absolute: 0.7 10*3/uL (ref 0.1–1.0)
Monocytes Relative: 7 %
Neutro Abs: 6.6 10*3/uL (ref 1.7–7.7)
Neutrophils Relative %: 71 %
Platelets: 363 10*3/uL (ref 150–400)
RBC: 3.95 MIL/uL (ref 3.87–5.11)
RDW: 12.3 % (ref 11.5–15.5)
WBC: 9.4 10*3/uL (ref 4.0–10.5)
nRBC: 0 % (ref 0.0–0.2)

## 2024-01-22 LAB — LIPASE, BLOOD: Lipase: 22 U/L (ref 11–51)

## 2024-01-22 MED ORDER — METOCLOPRAMIDE HCL 10 MG PO TABS: 10.0000 mg | ORAL_TABLET | Freq: Three times a day (TID) | ORAL | 0 refills | Status: DC | PRN

## 2024-01-22 MED ORDER — DIPHENHYDRAMINE HCL 50 MG/ML IJ SOLN
12.5000 mg | Freq: Once | INTRAMUSCULAR | Status: AC
  Administered 2024-01-22: 12.5 mg via INTRAVENOUS
  Filled 2024-01-22: qty 1

## 2024-01-22 MED ORDER — LACTATED RINGERS IV BOLUS
2000.0000 mL | Freq: Once | INTRAVENOUS | Status: AC
  Administered 2024-01-22: 2000 mL via INTRAVENOUS

## 2024-01-22 MED ORDER — METOCLOPRAMIDE HCL 5 MG/ML IJ SOLN
10.0000 mg | Freq: Once | INTRAMUSCULAR | Status: AC
  Administered 2024-01-22: 10 mg via INTRAVENOUS
  Filled 2024-01-22: qty 2

## 2024-01-22 MED ORDER — DROPERIDOL 2.5 MG/ML IJ SOLN
2.5000 mg | Freq: Once | INTRAMUSCULAR | Status: AC
  Administered 2024-01-22: 2.5 mg via INTRAVENOUS
  Filled 2024-01-22: qty 2

## 2024-01-22 MED ORDER — PROMETHAZINE HCL 25 MG RE SUPP: 25.0000 mg | Freq: Four times a day (QID) | RECTAL | 0 refills | Status: DC | PRN

## 2024-01-22 MED ORDER — ONDANSETRON HCL 4 MG/2ML IJ SOLN: 4.0000 mg | Freq: Once | INTRAMUSCULAR | Status: DC | PRN

## 2024-01-22 MED ORDER — PANTOPRAZOLE SODIUM 40 MG IV SOLR
40.0000 mg | Freq: Once | INTRAVENOUS | Status: AC
  Administered 2024-01-22: 40 mg via INTRAVENOUS
  Filled 2024-01-22: qty 10

## 2024-01-22 NOTE — Telephone Encounter (Signed)
 New patient appointment are not ava to sch until June 2025 when the sch opens up for new patients.  Copied from CRM [id number]. Topic: Appointments - Scheduling Inquiry for Clinic >> Jan 21, 2024  3:25 PM Suzette B wrote: Reason for CRM: Patient has called to advise she was told in the ER today that she is needing to see a provider/establish care due to the readings from her liver. Attempted to schedule; however theres no availability for the up coming days

## 2024-01-22 NOTE — Discharge Instructions (Addendum)
 Your workup today was reassuring.  You had symptom control with medicines in the emergency department.  You are able to tolerate something to eat or drink.  He had a CT scan yesterday that did not show any concerning findings.  Please follow-up with your primary care doctor and a gastroenterologist.  Return for emergency symptoms.

## 2024-01-22 NOTE — ED Provider Notes (Signed)
 [place] [facility] Provider Note   CSN: [id number] Arrival date & time: 01/22/24  6213     History  Chief Complaint  Patient presents with   Nausea    Regina Miller is a 28 y.o. female.  28 year old female presents today for concern of persistent nausea and vomiting.  She was seen by this provider yesterday.  She states since going home she has not been able to tolerate any p.o. intake.  Appears uncomfortable during exam.  She states she has been compliant with the medications that she was sent home with.  Denies any worsening in her abdominal pain.  This is her third visit in 3 days.  The history is provided by the patient. No language interpreter was used.       Home Medications Prior to Admission medications   Medication Sig Start Date End Date Taking? Authorizing Provider  azithromycin (ZITHROMAX) 250 MG tablet Take 2 tablets (500 mg) on day 1, after that take 1 tablet (250 mg) daily until complete. 12/15/23   Roemhildt, Lorin T, PA-C  benzonatate (TESSALON) 100 MG capsule Take 1 capsule (100 mg total) by mouth every 8 (eight) hours. 12/15/23   Roemhildt, Lorin T, PA-C  dicyclomine (BENTYL) 20 MG tablet Take 1 tablet (20 mg total) by mouth 2 (two) times daily. 01/21/24   Marita Kansas, PA-C  hydrOXYzine (ATARAX) 25 MG tablet Take 1-2 tablets (25-50 mg total) by mouth at bedtime as needed for anxiety. 06/16/23   Raspet, Noberto Retort, PA-C  loperamide (IMODIUM) 2 MG capsule Take 1 capsule (2 mg total) by mouth 4 (four) times daily as needed for diarrhea or loose stools. 01/06/24   Kommor, Madison, MD  ondansetron (ZOFRAN) 4 MG tablet Take 1 tablet (4 mg total) by mouth every 6 (six) hours. 12/15/23   Roemhildt, Lorin T, PA-C  ondansetron (ZOFRAN-ODT) 4 MG disintegrating tablet Take 1 tablet (4 mg total) by mouth every 8 (eight) hours as needed. 01/21/24   Karie Mainland, Shloma Roggenkamp, PA-C  pantoprazole (PROTONIX) 40 MG tablet Take 1 tablet (40 mg total) by mouth daily.  01/21/24   Marita Kansas, PA-C  diphenhydrAMINE (BENADRYL) 25 mg capsule Take 1 capsule (25 mg total) by mouth every 8 (eight) hours as needed for allergies. 07/19/19 10/18/19  Betancourt, Jarold Song, NP  loratadine (CLARITIN) 10 MG tablet Take 1 tablet (10 mg total) by mouth daily. 07/19/19 10/18/19  Betancourt, Jarold Song, NP      Allergies    Kiwi extract and Other    Review of Systems   Review of Systems  Constitutional:  Negative for chills and fever.  Gastrointestinal:  Positive for abdominal pain, nausea and vomiting.  Neurological:  Negative for light-headedness.  All other systems reviewed and are negative.   Physical Exam Updated Vital Signs BP 112/66   Pulse 66   Temp 98.2 F (36.8 C) (Oral)   Resp 18   LMP 01/18/2024   SpO2 99%  Physical Exam Vitals and nursing note reviewed.  Constitutional:      General: She is not in acute distress.    Appearance: Normal appearance. She is not ill-appearing.  HENT:     Head: Normocephalic and atraumatic.     Nose: Nose normal.  Eyes:     General: No scleral icterus.    Extraocular Movements: Extraocular movements intact.     Conjunctiva/sclera: Conjunctivae normal.  Cardiovascular:     Rate and Rhythm: Normal rate and regular rhythm.  Pulmonary:  Effort: Pulmonary effort is normal. No respiratory distress.     Breath sounds: Normal breath sounds. No wheezing or rales.  Abdominal:     General: There is no distension.     Tenderness: There is abdominal tenderness. There is no guarding.  Musculoskeletal:        General: Normal range of motion.     Cervical back: Normal range of motion.  Skin:    General: Skin is warm and dry.  Neurological:     General: No focal deficit present.     Mental Status: She is alert. Mental status is at baseline.     ED Results / Procedures / Treatments   Labs (all labs ordered are listed, but only abnormal results are displayed) Labs Reviewed  LIPASE, BLOOD  COMPREHENSIVE METABOLIC PANEL WITH  GFR  CBC WITH DIFFERENTIAL/PLATELET    EKG None  Radiology CT ABDOMEN PELVIS W CONTRAST Result Date: 01/21/2024 CLINICAL DATA:  Abdominal pain, acute, nonlocalized EXAM: CT ABDOMEN AND PELVIS WITH CONTRAST TECHNIQUE: Multidetector CT imaging of the abdomen and pelvis was performed using the standard protocol following bolus administration of intravenous contrast. RADIATION DOSE REDUCTION: This exam was performed according to the departmental dose-optimization program which includes automated exposure control, adjustment of the mA and/or kV according to patient size and/or use of iterative reconstruction technique. CONTRAST:  OMNIPAQUE IOHEXOL 300 MG/ML  SOLN COMPARISON:  None Available. FINDINGS: Lower chest: There are subpleural atelectatic changes in the visualized lung bases. No overt consolidation. No pleural effusion. The heart is normal in size. No pericardial effusion. Hepatobiliary: The liver is normal in size. Non-cirrhotic configuration. There are 2, hypoattenuating structures in the liver with largest in the right hepatic dome, segment 8 measuring 9 x 12 mm. These can not be characterized as simple cyst. These are indeterminate. Further evaluation with nonemergent MRI abdomen as per liver mass protocol is recommended. No intrahepatic or extrahepatic bile duct dilation. No calcified gallstones. Normal gallbladder wall thickness. No pericholecystic inflammatory changes. Pancreas: Unremarkable. No pancreatic ductal dilatation or surrounding inflammatory changes. Spleen: Within normal limits. No focal lesion. Adrenals/Urinary Tract: Adrenal glands are unremarkable. No suspicious renal mass. No hydronephrosis. No renal or ureteric calculi. Unremarkable urinary bladder. Stomach/Bowel: No disproportionate dilation of the small or large bowel loops. No evidence of abnormal bowel wall thickening or inflammatory changes. The appendix is unremarkable. Vascular/Lymphatic: No ascites or pneumoperitoneum.  No abdominal or pelvic lymphadenopathy, by size criteria. No aneurysmal dilation of the major abdominal arteries. Reproductive: The uterus is unremarkable. The ovaries are unremarkable. Other: There is a tiny fat containing umbilical hernia. The soft tissues and abdominal wall are otherwise unremarkable. Musculoskeletal: No suspicious osseous lesions. IMPRESSION: 1. No acute inflammatory process identified within the abdomen or pelvis. 2. There are 2, indeterminate hypoattenuating structures in the liver with largest measuring up to 9 x 12 mm. Further evaluation with nonemergent MRI abdomen as per liver mass protocol is recommended. 3. Multiple other nonacute observations, as described above. Electronically Signed   By: Jules Schick M.D.   On: 01/21/2024 14:00    Procedures Procedures    Medications Ordered in ED Medications  droperidol (INAPSINE) 2.5 MG/ML injection 2.5 mg (has no administration in time range)  pantoprazole (PROTONIX) injection 40 mg (40 mg Intravenous Given 01/22/24 0934)  diphenhydrAMINE (BENADRYL) injection 12.5 mg (12.5 mg Intravenous Given 01/22/24 0937)  lactated ringers bolus 2,000 mL (2,000 mLs Intravenous New Bag/Given 01/22/24 0945)    ED Course/ Medical Decision Making/ A&P Clinical Course  as of 01/22/24 1422  Fri Jan 22, 2024  1307 Patient on reevaluation is been able to tolerate some p.o. intake but states since then has became nauseous and had an episode of emesis 10 minutes ago.  Will give additional dose of Reglan and anticipate discharge with prescription for Reglan and Phenergan suppository. [AA]    Clinical Course User Index [AA] Marita Kansas, PA-C                                 Medical Decision Making Amount and/or Complexity of Data Reviewed Labs: ordered.  Risk Prescription drug management.   Medical Decision Making / ED Course   This patient presents to the ED for concern of vomiting, this involves an extensive number of treatment options, and  is a complaint that carries with it a high risk of complications and morbidity.  The differential diagnosis includes gastroenteritis, gastroparesis,  MDM: 28 year old female presents today for concern of persistent nausea vomiting.  She was seen yesterday by this provider.  She did have symptom improvement yesterday prior to going home and out of.  Since then she has had recurrent episodes.  Despite taking her medicines.  She states she did call the gastroenterology office and they are supposed to call her back today for a follow-up appointment.  Will repeat labs.  Will provide her with droperidol and Protonix and reevaluate.  Following droperidol patient was able to tolerate p.o. challenge.  However she states since then she has been nauseous and had 1 episode of emesis.  Reglan given.  Patient states she is ready for discharge.  Discharged in stable condition.  Her blood work was reassuring.  No leukocytosis, no anemia.  Without renal insufficiency and normal electrolytes.  Normal vital signs as well.  No concern for an acute or concerning process at this time.  Patient had a CT scan done yesterday which showed no acute process.  Do not feel we need to repeat this today.   Additional history obtained: -Additional history obtained from friend at bedside, outside records -External records from outside source obtained and reviewed including: Chart review including previous notes, labs, imaging, consultation notes   Lab Tests: -I ordered, reviewed, and interpreted labs.   The pertinent results include:   Labs Reviewed  COMPREHENSIVE METABOLIC PANEL WITH GFR - Abnormal; Notable for the following components:      Result Value   Glucose, Bld 104 (*)    Creatinine, Ser 1.07 (*)    All other components within normal limits  LIPASE, BLOOD  CBC WITH DIFFERENTIAL/PLATELET      EKG  EKG Interpretation Date/Time:    Ventricular Rate:    PR Interval:    QRS Duration:    QT Interval:     QTC Calculation:   R Axis:      Text Interpretation:         Medicines ordered and prescription drug management: Meds ordered this encounter  Medications   DISCONTD: ondansetron (ZOFRAN) injection 4 mg   pantoprazole (PROTONIX) injection 40 mg   droperidol (INAPSINE) 2.5 MG/ML injection 2.5 mg   diphenhydrAMINE (BENADRYL) injection 12.5 mg   lactated ringers bolus 2,000 mL   metoCLOPramide (REGLAN) injection 10 mg   metoCLOPramide (REGLAN) 10 MG tablet    Sig: Take 1 tablet (10 mg total) by mouth every 8 (eight) hours as needed for nausea.    Dispense:  21 tablet  Refill:  0    Supervising Provider:   Eber Hong [[id number]]   promethazine (PHENERGAN) 25 MG suppository    Sig: Place 1 suppository (25 mg total) rectally every 6 (six) hours as needed for nausea or vomiting.    Dispense:  12 each    Refill:  0    Supervising Provider:   Eber Hong [[id number]]    -I have reviewed the patients home medicines and have made adjustments as needed  Reevaluation: After the interventions noted above, I reevaluated the patient and found that they have :improved  Co morbidities that complicate the patient evaluation  Past Medical History:  Diagnosis Date   Acid reflux    Anxiety    Asthma    Attention deficit disorder    Heart murmur    Reports she had a heart murmur as a baby that has been evaluated. She is not currently followed by a cardiologist.      Dispostion: Discharged in stable condition.  Return precaution discussed.  Patient voices understanding and is in agreement with plan.   Final Clinical Impression(s) / ED Diagnoses Final diagnoses:  None    Rx / DC Orders ED Discharge Orders     None         Marita Kansas, PA-C 01/22/24 1429    Long, Arlyss Repress, MD 01/25/24 [phone]

## 2024-01-22 NOTE — ED Triage Notes (Signed)
 Pt reports vomiting x5 days. Pt reports constant nausea and dry heaving. Reports being unable to keep water and food down.  Reports taking zorfan at home with no relief. Pt throwing up and dry heaving, and tearful in triage.

## 2024-01-23 ENCOUNTER — Observation Stay (HOSPITAL_COMMUNITY)
Admission: EM | Admit: 2024-01-23 | Discharge: 2024-01-24 | Disposition: A | Discharge status: Home / Self Care | Payer: Self-pay | Attending: Internal Medicine | Admitting: Internal Medicine

## 2024-01-23 ENCOUNTER — Other Ambulatory Visit: Payer: Self-pay

## 2024-01-23 ENCOUNTER — Encounter (HOSPITAL_COMMUNITY): Payer: Self-pay | Admitting: Emergency Medicine

## 2024-01-23 DIAGNOSIS — F411 Generalized anxiety disorder: Secondary | ICD-10-CM | POA: Diagnosis present

## 2024-01-23 DIAGNOSIS — R112 Nausea with vomiting, unspecified: Secondary | ICD-10-CM | POA: Diagnosis present

## 2024-01-23 DIAGNOSIS — Z79899 Other long term (current) drug therapy: Secondary | ICD-10-CM | POA: Insufficient documentation

## 2024-01-23 DIAGNOSIS — K769 Liver disease, unspecified: Secondary | ICD-10-CM | POA: Diagnosis not present

## 2024-01-23 DIAGNOSIS — J45909 Unspecified asthma, uncomplicated: Secondary | ICD-10-CM | POA: Insufficient documentation

## 2024-01-23 DIAGNOSIS — Z87891 Personal history of nicotine dependence: Secondary | ICD-10-CM | POA: Diagnosis not present

## 2024-01-23 DIAGNOSIS — F419 Anxiety disorder, unspecified: Secondary | ICD-10-CM | POA: Diagnosis not present

## 2024-01-23 DIAGNOSIS — F603 Borderline personality disorder: Secondary | ICD-10-CM | POA: Diagnosis present

## 2024-01-23 DIAGNOSIS — K219 Gastro-esophageal reflux disease without esophagitis: Secondary | ICD-10-CM | POA: Diagnosis present

## 2024-01-23 LAB — URINALYSIS, ROUTINE W REFLEX MICROSCOPIC
Bilirubin Urine: NEGATIVE
Glucose, UA: NEGATIVE mg/dL
Ketones, ur: 5 mg/dL — AB
Leukocytes,Ua: NEGATIVE
Nitrite: NEGATIVE
Protein, ur: NEGATIVE mg/dL
Specific Gravity, Urine: 1.013 (ref 1.005–1.030)
pH: 7 (ref 5.0–8.0)

## 2024-01-23 LAB — COMPREHENSIVE METABOLIC PANEL WITH GFR
ALT: 18 U/L (ref 0–44)
AST: 14 U/L — ABNORMAL LOW (ref 15–41)
Albumin: 4.2 g/dL (ref 3.5–5.0)
Alkaline Phosphatase: 63 U/L (ref 38–126)
Anion gap: 10 (ref 5–15)
BUN: 9 mg/dL (ref 6–20)
CO2: 24 mmol/L (ref 22–32)
Calcium: 9.2 mg/dL (ref 8.9–10.3)
Chloride: 104 mmol/L (ref 98–111)
Creatinine, Ser: 0.93 mg/dL (ref 0.44–1.00)
GFR, Estimated: >60 mL/min (ref >60)
Glucose, Bld: 96 mg/dL (ref 70–99)
Potassium: 3.5 mmol/L (ref 3.5–5.1)
Sodium: 138 mmol/L (ref 135–145)
Total Bilirubin: 0.7 mg/dL (ref 0.0–1.2)
Total Protein: 7.5 g/dL (ref 6.5–8.1)

## 2024-01-23 LAB — CBC
HCT: 39.3 % (ref 36.0–46.0)
Hemoglobin: 12.9 g/dL (ref 12.0–15.0)
MCH: 32.2 pg (ref 26.0–34.0)
MCHC: 32.8 g/dL (ref 30.0–36.0)
MCV: 98 fL (ref 80.0–100.0)
Platelets: 369 10*3/uL (ref 150–400)
RBC: 4.01 MIL/uL (ref 3.87–5.11)
RDW: 12.2 % (ref 11.5–15.5)
WBC: 9.1 10*3/uL (ref 4.0–10.5)
nRBC: 0 % (ref 0.0–0.2)

## 2024-01-23 LAB — TSH: TSH: 2.874 u[IU]/mL (ref 0.350–4.500)

## 2024-01-23 LAB — RAPID URINE DRUG SCREEN, HOSP PERFORMED
Amphetamines: NOT DETECTED
Barbiturates: NOT DETECTED
Benzodiazepines: NOT DETECTED
Cocaine: NOT DETECTED
Opiates: NOT DETECTED
Tetrahydrocannabinol: NOT DETECTED

## 2024-01-23 LAB — MAGNESIUM: Magnesium: 2.2 mg/dL (ref 1.7–2.4)

## 2024-01-23 LAB — HCG, SERUM, QUALITATIVE: Preg, Serum: NEGATIVE

## 2024-01-23 LAB — LIPASE, BLOOD: Lipase: 20 U/L (ref 11–51)

## 2024-01-23 MED ORDER — LIDOCAINE VISCOUS HCL 2 % MT SOLN
15.0000 mL | Freq: Once | OROMUCOSAL | Status: AC
  Administered 2024-01-23: 15 mL via ORAL
  Filled 2024-01-23: qty 15

## 2024-01-23 MED ORDER — ALUM & MAG HYDROXIDE-SIMETH 200-200-20 MG/5ML PO SUSP
30.0000 mL | Freq: Once | ORAL | Status: AC
  Administered 2024-01-23: 30 mL via ORAL
  Filled 2024-01-23: qty 30

## 2024-01-23 MED ORDER — SODIUM CHLORIDE 0.9 % IV BOLUS
1000.0000 mL | Freq: Once | INTRAVENOUS | Status: AC
  Administered 2024-01-23: 1000 mL via INTRAVENOUS

## 2024-01-23 MED ORDER — ACETAMINOPHEN 650 MG RE SUPP: 650.0000 mg | Freq: Four times a day (QID) | RECTAL | Status: DC | PRN

## 2024-01-23 MED ORDER — PANTOPRAZOLE SODIUM 40 MG IV SOLR
40.0000 mg | INTRAVENOUS | Status: DC
  Administered 2024-01-24: 40 mg via INTRAVENOUS
  Filled 2024-01-23: qty 10

## 2024-01-23 MED ORDER — ONDANSETRON HCL 4 MG/2ML IJ SOLN: 4.0000 mg | Freq: Once | INTRAMUSCULAR | Status: DC

## 2024-01-23 MED ORDER — HYDROXYZINE HCL 25 MG PO TABS
25.0000 mg | ORAL_TABLET | Freq: Three times a day (TID) | ORAL | Status: DC | PRN
  Administered 2024-01-23 – 2024-01-24 (×3): 25 mg via ORAL
  Filled 2024-01-23 (×3): qty 1

## 2024-01-23 MED ORDER — NICOTINE 21 MG/24HR TD PT24
21.0000 mg | MEDICATED_PATCH | Freq: Every day | TRANSDERMAL | Status: DC
  Administered 2024-01-23: 21 mg via TRANSDERMAL
  Filled 2024-01-23: qty 1

## 2024-01-23 MED ORDER — ONDANSETRON HCL 4 MG/2ML IJ SOLN
4.0000 mg | Freq: Four times a day (QID) | INTRAMUSCULAR | Status: DC | PRN
  Administered 2024-01-24 (×2): 4 mg via INTRAVENOUS
  Filled 2024-01-23 (×2): qty 2

## 2024-01-23 MED ORDER — CYCLOBENZAPRINE HCL 5 MG PO TABS
5.0000 mg | ORAL_TABLET | Freq: Three times a day (TID) | ORAL | Status: DC | PRN
  Administered 2024-01-23 – 2024-01-24 (×3): 5 mg via ORAL
  Filled 2024-01-23 (×3): qty 1

## 2024-01-23 MED ORDER — DROPERIDOL 2.5 MG/ML IJ SOLN
2.5000 mg | Freq: Once | INTRAMUSCULAR | Status: AC
  Administered 2024-01-23: 2.5 mg via INTRAVENOUS
  Filled 2024-01-23: qty 2

## 2024-01-23 MED ORDER — PANTOPRAZOLE SODIUM 40 MG IV SOLR
40.0000 mg | Freq: Once | INTRAVENOUS | Status: AC
  Administered 2024-01-23: 40 mg via INTRAVENOUS
  Filled 2024-01-23: qty 10

## 2024-01-23 MED ORDER — SODIUM CHLORIDE 0.9 % IV SOLN: INTRAVENOUS | Status: DC

## 2024-01-23 MED ORDER — SODIUM CHLORIDE 0.9 % IV SOLN
12.5000 mg | Freq: Once | INTRAVENOUS | Status: AC
  Administered 2024-01-23: 12.5 mg via INTRAVENOUS
  Filled 2024-01-23: qty 12.5

## 2024-01-23 MED ORDER — SUCRALFATE 1 G PO TABS
1.0000 g | ORAL_TABLET | Freq: Once | ORAL | Status: AC
  Administered 2024-01-23: 1 g via ORAL
  Filled 2024-01-23: qty 1

## 2024-01-23 MED ORDER — ONDANSETRON HCL 4 MG PO TABS
4.0000 mg | ORAL_TABLET | Freq: Four times a day (QID) | ORAL | Status: DC | PRN
  Filled 2024-01-23: qty 1

## 2024-01-23 MED ORDER — SODIUM CHLORIDE 0.9 % IV SOLN: 12.5000 mg | Freq: Three times a day (TID) | INTRAVENOUS | Status: DC | PRN

## 2024-01-23 MED ORDER — ACETAMINOPHEN 325 MG PO TABS
650.0000 mg | ORAL_TABLET | Freq: Four times a day (QID) | ORAL | Status: DC | PRN
  Administered 2024-01-24: 650 mg via ORAL
  Filled 2024-01-23: qty 2

## 2024-01-23 NOTE — ED Notes (Signed)
PA at bedside assessing patient.

## 2024-01-23 NOTE — ED Provider Notes (Signed)
 [place] EMERGENCY DEPARTMENT AT [facility] Provider Note   CSN: [id number] Arrival date & time: 01/23/24  1155     History  Chief Complaint  Patient presents with   Abdominal Pain   Emesis    Regina Miller is a 28 y.o. female, hx of GERD, BPD, who presents to the ED 2/2 to persistent abdominal pain, this been going on for the last 7 days.  She is says that she has had persistent abdominal pain, predominately to the epigastric region, with no radiation, it has been going on for the last few days.  Has had nausea, vomiting, and reduced bowel movements, since then.  States she has had limited p.o. intake, and every time she tries to eat, she feels very sick to her stomach, and like she wants to vomit.  She reports since then, she has not been having any bowel movements, except for Regina Miller smears.  She does not feel constipated however.  She has been to the ER multiple times, and told that she has nothing wrong with her.  She says she cannot eat or drink, because even drinking, makes her have severe abdominal pain.  She denies any heavy NSAID use, or ingestion of any toxic products.  Denies any urinary symptoms, vaginal discharge, fevers or chills  Home Medications Prior to Admission medications   Medication Sig Start Date End Date Taking? Authorizing Provider  calcium carbonate (TUMS - DOSED IN MG ELEMENTAL CALCIUM) 500 MG chewable tablet Chew 2 tablets by mouth as needed for indigestion or heartburn.   Yes [provider]  dicyclomine (BENTYL) 20 MG tablet Take 1 tablet (20 mg total) by mouth 2 (two) times daily. 01/21/24  Yes Ali, Amjad, PA-C  ondansetron (ZOFRAN-ODT) 4 MG disintegrating tablet Take 1 tablet (4 mg total) by mouth every 8 (eight) hours as needed. 01/21/24  Yes Ali, Amjad, PA-C  pantoprazole (PROTONIX) 40 MG tablet Take 1 tablet (40 mg total) by mouth daily. 01/21/24  Yes Karie Mainland, Amjad, PA-C  diphenhydrAMINE (BENADRYL) 25 mg capsule Take 1 capsule (25 mg total)  by mouth every 8 (eight) hours as needed for allergies. 07/19/19 10/18/19  Betancourt, Jarold Song, NP  loratadine (CLARITIN) 10 MG tablet Take 1 tablet (10 mg total) by mouth daily. 07/19/19 10/18/19  Betancourt, Jarold Song, NP      Allergies    Kiwi extract and Other    Review of Systems   Review of Systems  Constitutional:  Negative for fever.  Gastrointestinal:  Positive for abdominal pain and vomiting.    Physical Exam Updated Vital Signs BP 109/69   Pulse (!) 50   Temp 98.9 F (37.2 C) (Oral)   Resp 16   LMP 01/18/2024   SpO2 98%  Physical Exam Vitals and nursing note reviewed.  Constitutional:      General: She is not in acute distress.    Appearance: She is well-developed.  HENT:     Head: Normocephalic and atraumatic.  Eyes:     Conjunctiva/sclera: Conjunctivae normal.  Cardiovascular:     Rate and Rhythm: Normal rate and regular rhythm.     Heart sounds: No murmur heard. Pulmonary:     Effort: Pulmonary effort is normal. No respiratory distress.     Breath sounds: Normal breath sounds.  Abdominal:     Palpations: Abdomen is soft.     Tenderness: There is abdominal tenderness in the epigastric area. There is no guarding or rebound.  Musculoskeletal:  General: No swelling.     Cervical back: Neck supple.  Skin:    General: Skin is warm and dry.     Capillary Refill: Capillary refill takes less than 2 seconds.  Neurological:     Mental Status: She is alert.  Psychiatric:        Mood and Affect: Mood normal.     ED Results / Procedures / Treatments   Labs (all labs ordered are listed, but only abnormal results are displayed) Labs Reviewed  COMPREHENSIVE METABOLIC PANEL WITH GFR - Abnormal; Notable for the following components:      Result Value   AST 14 (*)    All other components within normal limits  URINALYSIS, ROUTINE W REFLEX MICROSCOPIC - Abnormal; Notable for the following components:   Hgb urine dipstick MODERATE (*)    Ketones, ur 5 (*)     Bacteria, UA RARE (*)    All other components within normal limits  LIPASE, BLOOD  CBC  HCG, SERUM, QUALITATIVE  RAPID URINE DRUG SCREEN, HOSP PERFORMED    EKG None  Radiology No results found.   Procedures Procedures    Medications Ordered in ED Medications  alum & mag hydroxide-simeth (MAALOX/MYLANTA) 200-200-20 MG/5ML suspension 30 mL (30 mLs Oral Given 01/23/24 1255)    And  lidocaine (XYLOCAINE) 2 % viscous mouth solution 15 mL (15 mLs Oral Given 01/23/24 1311)  pantoprazole (PROTONIX) injection 40 mg (40 mg Intravenous Given 01/23/24 1303)  sodium chloride 0.9 % bolus 1,000 mL (1,000 mLs Intravenous New Bag/Given 01/23/24 1253)  promethazine (PHENERGAN) 12.5 mg in sodium chloride 0.9 % 50 mL IVPB (0 mg Intravenous Stopped 01/23/24 1400)  sucralfate (CARAFATE) tablet 1 g (1 g Oral Given 01/23/24 1258)  droperidol (INAPSINE) 2.5 MG/ML injection 2.5 mg (2.5 mg Intravenous Given 01/23/24 1509)    ED Course/ Medical Decision Making/ A&P                                 Medical Decision Making Patient is a 28 year old female, here for her fourth visit, for intractable nausea, vomiting.  She states she has not been able to eat or drink at home, and vomits every time she drinks anything.  Will try a GI cocktail, as this seems more also like versus GERD.  See if any relief, she is already had a CT scan that was negative, 2 days prior, thus will not repeat, UDS done a few days ago was negative for marijuana.  Amount and/or Complexity of Data Reviewed Labs: ordered.    Details: Unremarkable Discussion of management or test interpretation with external provider(s): Patient labs are unremarkable, have tried p.o. trial multiple times, vomits water up immediately.  She is unable to keep anything down, we will admit her to the hospitalist, Dr. Sheppard Penton, for further management, due to intractable nausea, vomiting, she has no abdominal pain however on exam.  And her CT abdomen pelvis was done 2 days prior  and was unremarkable.  Did have her little relief, with the medications, initially provided, but nausea/vomiting reoccurred.  Risk OTC drugs. Prescription drug management. Decision regarding hospitalization.    Final Clinical Impression(s) / ED Diagnoses Final diagnoses:  Intractable nausea and vomiting    Rx / DC Orders ED Discharge Orders     None         Dolphus Jenny, Harley Alto, PA 01/23/24 1531    Jacalyn Lefevre, MD 01/24/24 [phone]

## 2024-01-23 NOTE — Plan of Care (Signed)

## 2024-01-23 NOTE — ED Notes (Signed)
Ambulatory to BR to give urine sample

## 2024-01-23 NOTE — ED Triage Notes (Signed)
 Patient has experienced episodes of vomiting for 7 days. She reports abdominal pain on the left side and upper. It originally burned, but now she says it more cramping in nature. Her last bowel movement was 7 days. She is currently take pain medication. Pain is worse in the am.

## 2024-01-23 NOTE — Assessment & Plan Note (Signed)
 28 year old with 8 day history intractable nausea and vomiting with epigastric pain -obs to tele x 24 hours -continue IVF -4th ED visit since 01/20/24 as she hasn't been able to keep anything down  -continue anti-emetics with zofran and phenergan for refractory N/V -PPI x 24 hours, PUD/gastritis. Well water at home. She is stressed/vapes/poor diet. If no improvement consult GI tomorrow  -UDS negative -lipase, pregnancy, LFTs and gallbladder/BD imaging normal  -CT abdomen/pelvis with no acute finding  -advance diet as tolerated, she is asking for regular food

## 2024-01-23 NOTE — Assessment & Plan Note (Signed)
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  -Recommend further eval with nonemergent MRI abdomen as per liver mass protocol recommended.  -discussed with patient to f/u with PCP

## 2024-01-23 NOTE — H&P (Signed)
 History and Physical    Patient: Regina Miller GNF:[id number] DOB: April 18, 1996 DOA: 01/23/2024 DOS: the patient was seen and examined on 01/23/2024 PCP: Patient, No Pcp Per  Patient coming from: Home - lives with her mom    Chief Complaint: intractable N/V  HPI: CRYSTALL DONALDSON is a 28 y.o. female with medical history significant of GERD, ADHD, asthma and anxiety who presented to ED with complaints of intractable N/V. This is her 4th ED visit since 01/20/24. She states her symptoms started on March 29th. She started to feel bad for the first few days and then started to throw up daily. Vomiting episodes vary from 3-9x/day. She dry heaves quite a bit and has not really been eating. Had some episodes with blood, denies any coffee ground emesis. Denies any diarrhea, more not having much BM. She has pain in her epigastric area. Food or drinking make the pain worse and moving. Pain doesn't radiate. Ice chips/cold water make it better. Pain rated as a 10/10 and feels like a hunger pain. No other sick contacts. She is a Curator.   She does not use NSAIDs, but is stressed and eats a lot of fatty/fried foods. Denies any caffeine use. No travel. Well water at her home.   Denies any fever/chills, vision changes/headaches, + chest pain or palpitations, diarrhea, dysuria or leg swelling.   She vapes daily and no alcohol. NO drugs.   ER Course:  vitals: afebrile, bp: 128/82, HR: 66, RR: 16, oxygen: 100%RA Pertinent labs: UA with ketones and hgb CT abdomen/pelvis: no acute finding. 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 Further eval with nonemergent MRI abdomen as per liver mass protocol recommended.  In ED: Given GI cocktail, 1L IVF, carafate, phenergan, protonix and droperidol. Failed food challenge. TRH asked to admit.    Review of Systems: As mentioned in the history of present illness. All other systems reviewed and are negative. Past Medical  History:  Diagnosis Date   Acid reflux    Anxiety    Asthma    Attention deficit disorder    Heart murmur    Reports she had a heart murmur as a baby that has been evaluated. She is not currently followed by a cardiologist.   Past Surgical History:  Procedure Laterality Date   TONSILLECTOMY     TYMPANOSTOMY TUBE PLACEMENT Bilateral    Social History:  reports that she has quit smoking. Her smoking use included cigarettes. She has never used smokeless tobacco. She reports current alcohol use. She reports that she does not currently use drugs after having used the following drugs: Marijuana.  Allergies  Allergen Reactions   Kiwi Extract Nausea And Vomiting   Other Hives    Nair hair removal    Family History  Problem Relation Age of Onset   Psoriasis Mother    Hypertension Father    Eczema Father    Diabetes Father    Heart disease Paternal Grandmother    Cancer Maternal Grandmother     Prior to Admission medications   Medication Sig Start Date End Date Taking? Authorizing Provider  calcium carbonate (TUMS - DOSED IN MG ELEMENTAL CALCIUM) 500 MG chewable tablet Chew 2 tablets by mouth as needed for indigestion or heartburn.   Yes [provider]  dicyclomine (BENTYL) 20 MG tablet Take 1 tablet (20 mg total) by mouth 2 (two) times daily. 01/21/24  Yes Ali, Amjad, PA-C  ondansetron (ZOFRAN-ODT) 4 MG disintegrating tablet Take  1 tablet (4 mg total) by mouth every 8 (eight) hours as needed. 01/21/24  Yes Ali, Amjad, PA-C  pantoprazole (PROTONIX) 40 MG tablet Take 1 tablet (40 mg total) by mouth daily. 01/21/24  Yes Karie Mainland, Amjad, PA-C  diphenhydrAMINE (BENADRYL) 25 mg capsule Take 1 capsule (25 mg total) by mouth every 8 (eight) hours as needed for allergies. 07/19/19 10/18/19  Betancourt, Jarold Song, NP  loratadine (CLARITIN) 10 MG tablet Take 1 tablet (10 mg total) by mouth daily. 07/19/19 10/18/19  Barbaraann Barthel, NP    Physical Exam: Vitals:   01/23/24 1203 01/23/24 1300  01/23/24 1400 01/23/24 1552  BP: 128/82 (!) 146/133 109/69   Pulse: 66 (!) 50    Resp: 16 16    Temp: 98.9 F (37.2 C)   (!) 97.5 F (36.4 C)  TempSrc: Oral   Oral  SpO2: 100% 99% 98%    General:  Appears calm and comfortable and is in NAD Eyes:  PERRL, EOMI, normal lids, iris ENT:  grossly normal hearing, lips & tongue, mmm; appropriate dentition Neck:  no LAD, masses or thyromegaly; no carotid bruits Cardiovascular:  RRR, no m/r/g. No LE edema.  Respiratory:   CTA bilaterally with no wheezes/rales/rhonchi.  Normal respiratory effort. Abdomen:  soft, TTP in epigastric area, otherwise wnl. No rebound or guarding. BS hypoactive.  Back:   normal alignment, no CVAT Skin:  no rash or induration seen on limited exam Musculoskeletal:  grossly normal tone BUE/BLE, good ROM, no bony abnormality Lower extremity:  No LE edema.  Limited foot exam with no ulcerations.  2+ distal pulses. Psychiatric:  grossly normal mood and affect, speech fluent and appropriate, AOx3 Neurologic:  CN 2-12 grossly intact, moves all extremities in coordinated fashion, sensation intact   Radiological Exams on Admission: Independently reviewed - see discussion in A/P where applicable  No results found.  EKG: Independently reviewed.  Sinus bradycardia with rate 44; nonspecific ST changes with no evidence of acute ischemia   Labs on Admission: I have personally reviewed the available labs and imaging studies at the time of the admission.  Pertinent labs:   UA with ketones and hgb  Assessment and Plan: Principal Problem:   Intractable vomiting with nausea Active Problems:   Liver lesion    Assessment and Plan: * Intractable vomiting with nausea 28 year old with 8 day history intractable nausea and vomiting with epigastric pain -obs to tele x 24 hours -continue IVF -4th ED visit since 01/20/24 as she hasn't been able to keep anything down  -continue anti-emetics with zofran and phenergan for refractory  N/V -PPI x 24 hours, PUD/gastritis. Well water at home. She is stressed/vapes/poor diet. 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  -Recommend further eval with nonemergent MRI abdomen as per liver mass protocol recommended.  -discussed with patient to f/u with PCP     Advance Care Planning:   Code Status: Full Code    Consults: none   DVT Prophylaxis: SCDS/ambulation   Family Communication: none   Severity of Illness: The appropriate patient status for this patient is OBSERVATION. Observation status is judged to be reasonable and necessary in order to provide the required intensity of service to ensure  the patient's safety. The patient's presenting symptoms, physical exam findings, and initial radiographic and laboratory data in the context of their medical condition is felt to place them at decreased risk for further clinical deterioration. Furthermore, it is anticipated that the patient will be medically stable for discharge from the hospital within 2 midnights of admission.   Author: Orland Mustard, MD 01/23/2024 4:43 PM  For on call review [email or web address].

## 2024-01-24 MED ORDER — CYCLOBENZAPRINE HCL 5 MG PO TABS: 5.0000 mg | ORAL_TABLET | Freq: Three times a day (TID) | ORAL | 0 refills | Status: DC | PRN

## 2024-01-24 MED ORDER — PANTOPRAZOLE SODIUM 40 MG PO TBEC: 40.0000 mg | DELAYED_RELEASE_TABLET | Freq: Two times a day (BID) | ORAL | 0 refills | Status: DC

## 2024-01-24 MED ORDER — MAALOX MAX 400-400-40 MG/5ML PO SUSP: 15.0000 mL | Freq: Four times a day (QID) | ORAL | 0 refills | Status: DC | PRN

## 2024-01-24 MED ORDER — HYDROXYZINE HCL 25 MG PO TABS: 25.0000 mg | ORAL_TABLET | Freq: Three times a day (TID) | ORAL | 0 refills | Status: DC | PRN

## 2024-01-24 MED ORDER — PROMETHAZINE HCL 25 MG PO TABS: 25.0000 mg | ORAL_TABLET | Freq: Four times a day (QID) | ORAL | 0 refills | Status: DC | PRN

## 2024-01-24 MED ORDER — PROMETHAZINE HCL 25 MG PO TABS
25.0000 mg | ORAL_TABLET | Freq: Once | ORAL | Status: AC
  Administered 2024-01-24: 25 mg via ORAL
  Filled 2024-01-24: qty 1

## 2024-01-24 NOTE — Discharge Summary (Signed)
 Physician Discharge Summary  Regina Miller UJW:[id number] DOB: [birth year] DOA: 01/23/2024  PCP: Patient, No Pcp Per  Admit date: 01/23/2024 Discharge date: 01/24/2024  Admitted From: Home Disposition: Home  Recommendations for Outpatient Follow-up:  Follow up with PCP in 1 week with repeat CBC/BMP Outpatient evaluation and follow-up by GI if symptoms persist Follow up in ED if symptoms worsen or new appear   Home Health: No Equipment/Devices: None  Discharge Condition: Stable CODE STATUS: Full Diet recommendation: Heart healthy  Brief/Interim Summary:  28 y.o. female with medical history significant of GERD, ADHD, asthma and anxiety presented with intractable nausea and vomiting (this was her fourth ED visit since 01/20/2024).  On presentation, vitals were stable.  CT of abdomen and pelvis showed no acute findings but showed 2 indeterminate hypoattenuating structures in the liver; nonemergent MRI of abdomen for liver mass protocol was recommended.  She was treated with IV fluids, antiemetics.  During the hospitalization, her symptoms are improving.  She is tolerating regular diet for breakfast.  She will be discharged home if she tolerates lunch today.  Will need outpatient evaluation and follow-up by GI if symptoms persist.  She will need to work on finding a PCP and follow-up with her PCP.  She will need to have outpatient MRI of abdomen arranged by her PCP on a nonemergent basis.  Discharge Diagnoses:   Intractable nausea and vomiting Possible acute gastritis versus peptic ulcer disease -Questionable cause.  CT of abdomen and pelvis with no acute findings.  Workup negative so far including normal lipase, normal LFTs; urine drug screen negative. - Treated with IV fluids, analgesics, IV PPI.  Symptoms are improving. -  She is tolerating regular diet for breakfast.  She will be discharged home if she tolerates lunch today on oral PPI twice daily and as needed antiemetics.  Will need  outpatient evaluation and follow-up by GI if symptoms persist.  She will need to work on finding a PCP and follow-up with her PCP.  Liver lesion -Incidental finding on CT abdomen/pelvis -2 indeterminate hypoattenuating structures in the liver with largest measuring up to 9x80mm.  -Recommend further eval with nonemergent MRI abdomen as per liver mass protocol recommended.  -discussed with patient to f/u with PCP   Anxiety -Use hydroxyzine as needed for now.  Outpatient follow-up with PCP.   Discharge Instructions  Discharge Instructions     Ambulatory referral to Gastroenterology   Complete by: As directed    Hospital follow-up for intractable nausea and vomiting   What is the reason for referral?: Other   Diet - low sodium heart healthy   Complete by: As directed    Increase activity slowly   Complete by: As directed       Allergies as of 01/24/2024       Reactions   Kiwi Extract Nausea And Vomiting   Other Hives   Nair hair removal        Medication List     TAKE these medications    calcium carbonate 500 MG chewable tablet Commonly known as: TUMS - dosed in mg elemental calcium Chew 2 tablets by mouth as needed for indigestion or heartburn.   cyclobenzaprine 5 MG tablet Commonly known as: FLEXERIL Take 1 tablet (5 mg total) by mouth 3 (three) times daily as needed for muscle spasms.   dicyclomine 20 MG tablet Commonly known as: BENTYL Take 1 tablet (20 mg total) by mouth 2 (two) times daily.   hydrOXYzine 25 MG tablet Commonly known  as: ATARAX Take 1 tablet (25 mg total) by mouth 3 (three) times daily as needed for anxiety.   Maalox Max 400-400-40 MG/5ML suspension Generic drug: alum & mag hydroxide-simeth Take 15 mLs by mouth every 6 (six) hours as needed for indigestion.   ondansetron 4 MG disintegrating tablet Commonly known as: ZOFRAN-ODT Take 1 tablet (4 mg total) by mouth every 8 (eight) hours as needed.   pantoprazole 40 MG tablet Commonly  known as: Protonix Take 1 tablet (40 mg total) by mouth 2 (two) times daily before a meal. What changed: when to take this   promethazine 25 MG tablet Commonly known as: PHENERGAN Take 1 tablet (25 mg total) by mouth every 6 (six) hours as needed for refractory nausea / vomiting.        Follow-up Information     PCP. Schedule an appointment as soon as possible for a visit in 1 week(s).                 Allergies  Allergen Reactions   Kiwi Extract Nausea And Vomiting   Other Hives    Nair hair removal    Consultations: None   Procedures/Studies: CT ABDOMEN PELVIS W CONTRAST Result Date: 01/21/2024 CLINICAL DATA:  Abdominal pain, acute, nonlocalized EXAM: CT ABDOMEN AND PELVIS WITH CONTRAST TECHNIQUE: Multidetector CT imaging of the abdomen and pelvis was performed using the standard protocol following bolus administration of intravenous contrast. RADIATION DOSE REDUCTION: This exam was performed according to the departmental dose-optimization program which includes automated exposure control, adjustment of the mA and/or kV according to patient size and/or use of iterative reconstruction technique. CONTRAST:  OMNIPAQUE IOHEXOL 300 MG/ML  SOLN COMPARISON:  None Available. FINDINGS: Lower chest: There are subpleural atelectatic changes in the visualized lung bases. No overt consolidation. No pleural effusion. The heart is normal in size. No pericardial effusion. Hepatobiliary: The liver is normal in size. Non-cirrhotic configuration. There are 2, hypoattenuating structures in the liver with largest in the right hepatic dome, segment 8 measuring 9 x 12 mm. These can not be characterized as simple cyst. These are indeterminate. Further evaluation with nonemergent MRI abdomen as per liver mass protocol is recommended. No intrahepatic or extrahepatic bile duct dilation. No calcified gallstones. Normal gallbladder wall thickness. No pericholecystic inflammatory changes. Pancreas:  Unremarkable. No pancreatic ductal dilatation or surrounding inflammatory changes. Spleen: Within normal limits. No focal lesion. Adrenals/Urinary Tract: Adrenal glands are unremarkable. No suspicious renal mass. No hydronephrosis. No renal or ureteric calculi. Unremarkable urinary bladder. Stomach/Bowel: No disproportionate dilation of the small or large bowel loops. No evidence of abnormal bowel wall thickening or inflammatory changes. The appendix is unremarkable. Vascular/Lymphatic: No ascites or pneumoperitoneum. No abdominal or pelvic lymphadenopathy, by size criteria. No aneurysmal dilation of the major abdominal arteries. Reproductive: The uterus is unremarkable. The ovaries are unremarkable. Other: There is a tiny fat containing umbilical hernia. The soft tissues and abdominal wall are otherwise unremarkable. Musculoskeletal: No suspicious osseous lesions. IMPRESSION: 1. No acute inflammatory process identified within the abdomen or pelvis. 2. There are 2, indeterminate hypoattenuating structures in the liver with largest measuring up to 9 x 12 mm. Further evaluation with nonemergent MRI abdomen as per liver mass protocol is recommended. 3. Multiple other nonacute observations, as described above. Electronically Signed   By: Jules Schick M.D.   On: 01/21/2024 14:00      Subjective: Patient seen and examined at bedside.  Feels slightly better and is tolerating diet this morning.  Currently not  nauseous.  No fever, chest pain or shortness of breath reported.  Discharge Exam: Vitals:   01/24/24 0052 01/24/24 0436  BP: (!) 108/59 103/74  Pulse: (!) 49 64  Resp: 18 16  Temp: 97.8 F (36.6 C) 98 F (36.7 C)  SpO2: 97% 98%    General: Pt is alert, awake, not in acute distress.  Flat affect.  On room air. Cardiovascular: rate controlled, S1/S2 + Respiratory: bilateral decreased breath sounds at bases Abdominal: Soft, NT, ND, bowel sounds + Extremities: no edema, no cyanosis    The  results of significant diagnostics from this hospitalization (including imaging, microbiology, ancillary and laboratory) are listed below for reference.     Microbiology: No results found for this or any previous visit (from the past 240 hours).   Labs: BNP (last 3 results) No results for input(s): "BNP" in the last 8760 hours. Basic Metabolic Panel: Recent Labs  Lab 01/21/24 1132 01/22/24 0925 01/23/24 1255 01/23/24 1830  NA 137 137 138  --   K 3.8 3.7 3.5  --   CL 104 105 104  --   CO2 24 24 24   --   GLUCOSE 92 104* 96  --   BUN 13 12 9   --   CREATININE 1.01* 1.07* 0.93  --   CALCIUM 9.0 9.1 9.2  --   MG  --   --   --  2.2   Liver Function Tests: Recent Labs  Lab 01/21/24 1132 01/22/24 0925 01/23/24 1255  AST 14* 15 14*  ALT 17 16 18   ALKPHOS 65 64 63  BILITOT 0.6 0.6 0.7  PROT 7.1 7.2 7.5  ALBUMIN 3.8 3.8 4.2   Recent Labs  Lab 01/21/24 1132 01/22/24 0925 01/23/24 1255  LIPASE 19 22 20    No results for input(s): "AMMONIA" in the last 168 hours. CBC: Recent Labs  Lab 01/21/24 1132 01/22/24 0926 01/23/24 1255  WBC 6.9 9.4 9.1  NEUTROABS 4.6 6.6  --   HGB 13.4 12.8 12.9  HCT 39.7 37.4 39.3  MCV 95.4 94.7 98.0  PLT 349 363 369   Cardiac Enzymes: No results for input(s): "CKTOTAL", "CKMB", "CKMBINDEX", "TROPONINI" in the last 168 hours. BNP: Invalid input(s): "POCBNP" CBG: No results for input(s): "GLUCAP" in the last 168 hours. D-Dimer No results for input(s): "DDIMER" in the last 72 hours. Hgb A1c No results for input(s): "HGBA1C" in the last 72 hours. Lipid Profile No results for input(s): "CHOL", "HDL", "LDLCALC", "TRIG", "CHOLHDL", "LDLDIRECT" in the last 72 hours. Thyroid function studies Recent Labs    01/23/24 1744  TSH 2.874   Anemia work up No results for input(s): "VITAMINB12", "FOLATE", "FERRITIN", "TIBC", "IRON", "RETICCTPCT" in the last 72 hours. Urinalysis    Component Value Date/Time   COLORURINE YELLOW 01/23/2024 1227    APPEARANCEUR CLEAR 01/23/2024 1227   LABSPEC 1.013 01/23/2024 1227   PHURINE 7.0 01/23/2024 1227   GLUCOSEU NEGATIVE 01/23/2024 1227   HGBUR MODERATE (A) 01/23/2024 1227   BILIRUBINUR NEGATIVE 01/23/2024 1227   BILIRUBINUR neg 12/22/2013 1526   KETONESUR 5 (A) 01/23/2024 1227   PROTEINUR NEGATIVE 01/23/2024 1227   UROBILINOGEN 0.2 05/14/2014 1257   NITRITE NEGATIVE 01/23/2024 1227   LEUKOCYTESUR NEGATIVE 01/23/2024 1227   Sepsis Labs Recent Labs  Lab 01/21/24 1132 01/22/24 0926 01/23/24 1255  WBC 6.9 9.4 9.1   Microbiology No results found for this or any previous visit (from the past 240 hours).   Time coordinating discharge: 35 minutes  SIGNED:   Makari Portman  Hanley Ben, MD  [facility] 01/24/2024, 10:09 AM

## 2024-01-24 NOTE — Plan of Care (Signed)

## 2024-01-24 NOTE — Discharge Instructions (Signed)
 [facility] [employer] in [place] [employer] in [place]

## 2024-01-24 NOTE — TOC Initial Note (Signed)
 Transition of Care [facility]) - Initial/Assessment Note    Patient Details  Name: Regina Miller MRN: [id number] Date of Birth: Jul 23, 1996  [facility] CM/SW Contact:    Adrian Prows, RN Phone Number: 01/24/2024, 10:48 AM  Clinical Narrative:                 [facility] consult for PCP needs; also no insurance listed; spoke w/ pt in room; pt says she lives at home w/ her mother Regina Miller [phone]); she plans to return at d/c; pt confirmed she does not have insurance/PCP; pt says she has difficulty paying for food; she denies other SDOH risks; pt says she does not have DME, HH services, or home oxygen; she agrees to receive resources for PCP, insurance, and food; resources for social services, financial assistance, [facility], [employer] [facility] in [place], and [employer] in [place]; resources placed in d/c instructions; copies also given to pt; she will make appt at agencies and provider of choice, no TOC needs.  Expected Discharge Plan: Home/Self Care Barriers to Discharge: No Barriers Identified   Patient Goals and CMS Choice Patient states their goals for this hospitalization and ongoing recovery are:: HOME          Expected Discharge Plan and Services   Discharge Planning Services: CM Consult Post Acute Care Choice: NA Living arrangements for the past 2 months: Mobile Home Expected Discharge Date: 01/24/24               DME Arranged: N/A DME Agency: NA       HH Arranged: NA HH Agency: NA        Prior Living Arrangements/Services Living arrangements for the past 2 months: Mobile Home Lives with:: Parents Patient language and need for interpreter reviewed:: Yes Do you feel safe going back to the place where you live?: Yes      Need for Family Participation in Patient Care: Yes (Comment) Care giver support system in place?: Yes (comment) Current home services:   (N/A) Criminal Activity/Legal Involvement Pertinent to Current Situation/Hospitalization: No - Comment as needed  Activities of Daily Living      Permission Sought/Granted Permission sought to share information with : Case Manager Permission granted to share information with : Yes, Verbal Permission Granted  Share Information with NAME: Case Manager     Permission granted to share info w Relationship: Regina Miller (mother) [phone]     Emotional Assessment Appearance:: Appears stated age Attitude/Demeanor/Rapport: Gracious Affect (typically observed): Accepting Orientation: : Oriented to Self, Oriented to Place, Oriented to  Time, Oriented to Situation Alcohol / Substance Use: Not Applicable Psych Involvement: No (comment)  Admission diagnosis:  Intractable nausea and vomiting [R11.2] Intractable vomiting with nausea [R11.2] Patient Active Problem List   Diagnosis Date Noted   Intractable vomiting with nausea 01/23/2024   Liver lesion 01/23/2024   PTSD (post-traumatic stress disorder) 02/03/2023   Morbid obesity (HCC) 07/19/2019   Anxiety 12/22/2013   Eczema 12/22/2013   Body mass index, pediatric, greater than or 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 age 40/02/2014   Acne 04/26/2012   Insomnia 07/06/2011   Attention deficit hyperactivity disorder (ADHD) 12/17/2006   PCP:  Patient, No Pcp Per Pharmacy:   [facility] [place] [facility] [address] [place] [address] Phone: [phone] Fax: [phone]     Social Drivers of Health (SDOH) Social History: SDOH  Screenings   Food Insecurity: Food Insecurity Present (01/24/2024)  Housing: Low Risk  (01/24/2024)  Transportation Needs: No Transportation Needs (01/24/2024)  Utilities: Not At Risk (01/24/2024)  Depression (PHQ2-9): High Risk (02/03/2023)  Social Connections: Unknown (03/04/2022)   Received from [facility], [facility]  Tobacco Use: Medium Risk (01/23/2024)   SDOH  Interventions: Food Insecurity Interventions: [employer] Provided, Inpatient TOC Housing Interventions: Intervention Not Indicated, Inpatient TOC Transportation Interventions: Intervention Not Indicated, Inpatient TOC Utilities Interventions: Intervention Not Indicated, Inpatient TOC   Readmission Risk Interventions     No data to display

## 2024-02-15 ENCOUNTER — Encounter (HOSPITAL_COMMUNITY): Payer: Self-pay | Admitting: Emergency Medicine

## 2024-02-15 ENCOUNTER — Other Ambulatory Visit: Payer: Self-pay

## 2024-02-15 ENCOUNTER — Other Ambulatory Visit (HOSPITAL_BASED_OUTPATIENT_CLINIC_OR_DEPARTMENT_OTHER): Payer: Self-pay

## 2024-02-15 ENCOUNTER — Emergency Department (HOSPITAL_COMMUNITY)
Admission: EM | Admit: 2024-02-15 | Discharge: 2024-02-15 | Disposition: A | Discharge status: Home / Self Care | Payer: Self-pay | Attending: Emergency Medicine | Admitting: Emergency Medicine

## 2024-02-15 DIAGNOSIS — R42 Dizziness and giddiness: Secondary | ICD-10-CM | POA: Insufficient documentation

## 2024-02-15 DIAGNOSIS — R109 Unspecified abdominal pain: Secondary | ICD-10-CM | POA: Insufficient documentation

## 2024-02-15 DIAGNOSIS — R55 Syncope and collapse: Secondary | ICD-10-CM | POA: Diagnosis present

## 2024-02-15 DIAGNOSIS — R110 Nausea: Secondary | ICD-10-CM | POA: Diagnosis not present

## 2024-02-15 LAB — COMPREHENSIVE METABOLIC PANEL WITH GFR
ALT: 21 U/L (ref 0–44)
AST: 19 U/L (ref 15–41)
Albumin: 3.8 g/dL (ref 3.5–5.0)
Alkaline Phosphatase: 67 U/L (ref 38–126)
Anion gap: 8 (ref 5–15)
BUN: 11 mg/dL (ref 6–20)
CO2: 24 mmol/L (ref 22–32)
Calcium: 9.1 mg/dL (ref 8.9–10.3)
Chloride: 104 mmol/L (ref 98–111)
Creatinine, Ser: 0.7 mg/dL (ref 0.44–1.00)
GFR, Estimated: >60 mL/min (ref >60)
Glucose, Bld: 96 mg/dL (ref 70–99)
Potassium: 4.6 mmol/L (ref 3.5–5.1)
Sodium: 136 mmol/L (ref 135–145)
Total Bilirubin: 0.6 mg/dL (ref 0.0–1.2)
Total Protein: 7 g/dL (ref 6.5–8.1)

## 2024-02-15 LAB — URINALYSIS, ROUTINE W REFLEX MICROSCOPIC
Bilirubin Urine: NEGATIVE
Glucose, UA: NEGATIVE mg/dL
Ketones, ur: NEGATIVE mg/dL
Leukocytes,Ua: NEGATIVE
Nitrite: NEGATIVE
Protein, ur: NEGATIVE mg/dL
Specific Gravity, Urine: 1.005 (ref 1.005–1.030)
pH: 6 (ref 5.0–8.0)

## 2024-02-15 LAB — RAPID URINE DRUG SCREEN, HOSP PERFORMED
Amphetamines: NOT DETECTED
Barbiturates: NOT DETECTED
Benzodiazepines: NOT DETECTED
Cocaine: NOT DETECTED
Opiates: NOT DETECTED
Tetrahydrocannabinol: NOT DETECTED

## 2024-02-15 LAB — CBC WITH DIFFERENTIAL/PLATELET
Abs Immature Granulocytes: 0.03 10*3/uL (ref 0.00–0.07)
Basophils Absolute: 0 10*3/uL (ref 0.0–0.1)
Basophils Relative: 0 %
Eosinophils Absolute: 0.2 10*3/uL (ref 0.0–0.5)
Eosinophils Relative: 2 %
HCT: 40.5 % (ref 36.0–46.0)
Hemoglobin: 13.4 g/dL (ref 12.0–15.0)
Immature Granulocytes: 0 %
Lymphocytes Relative: 26 %
Lymphs Abs: 2.7 10*3/uL (ref 0.7–4.0)
MCH: 32.2 pg (ref 26.0–34.0)
MCHC: 33.1 g/dL (ref 30.0–36.0)
MCV: 97.4 fL (ref 80.0–100.0)
Monocytes Absolute: 0.6 10*3/uL (ref 0.1–1.0)
Monocytes Relative: 6 %
Neutro Abs: 6.9 10*3/uL (ref 1.7–7.7)
Neutrophils Relative %: 66 %
Platelets: 346 10*3/uL (ref 150–400)
RBC: 4.16 MIL/uL (ref 3.87–5.11)
RDW: 12.6 % (ref 11.5–15.5)
WBC: 10.5 10*3/uL (ref 4.0–10.5)
nRBC: 0 % (ref 0.0–0.2)

## 2024-02-15 LAB — PREGNANCY, URINE: Preg Test, Ur: NEGATIVE

## 2024-02-15 MED ORDER — METOCLOPRAMIDE HCL 5 MG/ML IJ SOLN
10.0000 mg | Freq: Once | INTRAMUSCULAR | Status: AC
  Administered 2024-02-15: 10 mg via INTRAVENOUS
  Filled 2024-02-15: qty 2

## 2024-02-15 MED ORDER — PANTOPRAZOLE SODIUM 40 MG PO TBEC: 40.0000 mg | DELAYED_RELEASE_TABLET | Freq: Two times a day (BID) | ORAL | 1 refills | Status: DC

## 2024-02-15 MED ORDER — PROMETHAZINE HCL 25 MG PO TABS
25.0000 mg | ORAL_TABLET | Freq: Four times a day (QID) | ORAL | 0 refills | Status: DC | PRN
  Filled 2024-02-15: qty 20, 5d supply, fill #0

## 2024-02-15 MED ORDER — PROMETHAZINE HCL 25 MG PO TABS: 25.0000 mg | ORAL_TABLET | Freq: Four times a day (QID) | ORAL | 0 refills | Status: AC | PRN

## 2024-02-15 MED ORDER — CYCLOBENZAPRINE HCL 5 MG PO TABS
5.0000 mg | ORAL_TABLET | Freq: Three times a day (TID) | ORAL | 0 refills | Status: DC | PRN
  Filled 2024-02-15: qty 30, 10d supply, fill #0

## 2024-02-15 MED ORDER — PANTOPRAZOLE SODIUM 40 MG PO TBEC
40.0000 mg | DELAYED_RELEASE_TABLET | Freq: Two times a day (BID) | ORAL | 1 refills | Status: DC
  Filled 2024-02-15: qty 60, 30d supply, fill #0

## 2024-02-15 MED ORDER — CYCLOBENZAPRINE HCL 5 MG PO TABS: 5.0000 mg | ORAL_TABLET | Freq: Three times a day (TID) | ORAL | 0 refills | Status: DC | PRN

## 2024-02-15 MED ORDER — HYDROXYZINE HCL 25 MG PO TABS
25.0000 mg | ORAL_TABLET | Freq: Three times a day (TID) | ORAL | 1 refills | Status: DC | PRN
  Filled 2024-02-15: qty 30, 10d supply, fill #0

## 2024-02-15 MED ORDER — HYDROXYZINE HCL 25 MG PO TABS: 25.0000 mg | ORAL_TABLET | Freq: Three times a day (TID) | ORAL | 1 refills | Status: AC | PRN

## 2024-02-15 MED ORDER — PANTOPRAZOLE SODIUM 40 MG IV SOLR
80.0000 mg | Freq: Once | INTRAVENOUS | Status: AC
Start: 2024-02-15 — End: 2024-02-15
  Administered 2024-02-15: 80 mg via INTRAVENOUS
  Filled 2024-02-15: qty 20

## 2024-02-15 NOTE — ED Triage Notes (Signed)
 Pt BIB EMS from home, c/o abdominal pain and N/V x 1 month. Pt reporting loss of consciousness for a few minutes after getting out of the shower this morning. Dx with stomach ulcers, prescribed Prilosec. Pt stopped taking medication after ulcer resolved. Also feels like she is diabetic d/t fluctuating blood sugar reading.   BP  P RR SpO2 CBG

## 2024-02-15 NOTE — ED Provider Notes (Signed)
 [place] EMERGENCY DEPARTMENT AT [facility] Provider Note   CSN: [id number] Arrival date & time: 02/15/24  0930     History  Chief Complaint  Patient presents with   Abdominal Pain   Loss of Consciousness    Regina Miller is a 28 y.o. female.  HPI    28 year old female comes in with chief complaint of abdominal pain, loss of consciousness.  Patient has history of ADHD, elevated BMI. She indicates that she has had some problems with nausea, vomiting, abdominal discomfort for the last month.  She required admission earlier this month and also has had an episode of syncope.  When she was admitted to the hospital, she was advised that she will need GI follow-up.  That follow-up is coming up on Friday.  She was aware that she had liver lesions that we will need outpatient workup.  Unfortunately, patient does not have insurance and does not have PCP follow-up at this time.  Patient states that she continues to have episodes of nausea, dizziness.  She had a viral illness sometime in March, when she improved -there was residual dizziness, nausea, vomiting, abdominal discomfort.  Patient's abdominal pain is described as sharp pain, and throughout the day she will have episodes of it.  The duration of the pain is just couple of seconds.  Symptoms are worse when she eats food.  Symptoms also get worse if she does not eat food.  Whenever she does eat, she starts getting dizzy and has to lay down.  If she has taken promethazine , she will have to lay low for sometimes the whole day.  Dizziness is described as lightheadedness, unsteadiness.  Today patient had just completed a shower.  She was walking out, the next thing she remembers she was on the floor.  She was having some dizziness, but there was no chest pain, palpitations, shortness of breath.  The dizziness was no different than usual.  Additionally, patient indicates that she decided to just check her blood sugar on couple of  occasions.  She noted that her blood sugar was 189 on 1 occasion, just 3 minutes later she rechecked it and it was 118.  She is questioning if she is having massive sugar changes that is leading to her dizziness.  Home Medications Prior to Admission medications   Medication Sig Start Date End Date Taking? Authorizing Provider  alum & mag hydroxide-simeth (MAALOX MAX) 400-400-40 MG/5ML suspension Take 15 mLs by mouth every 6 (six) hours as needed for indigestion. 01/24/24   Audria Leather, MD  calcium  carbonate (TUMS - DOSED IN MG ELEMENTAL CALCIUM ) 500 MG chewable tablet Chew 2 tablets by mouth as needed for indigestion or heartburn.    [provider]  cyclobenzaprine  (FLEXERIL ) 5 MG tablet Take 1 tablet (5 mg total) by mouth 3 (three) times daily as needed for muscle spasms. 02/15/24   Deatra Face, MD  dicyclomine  (BENTYL ) 20 MG tablet Take 1 tablet (20 mg total) by mouth 2 (two) times daily. 01/21/24   Lucina Sabal, PA-C  hydrOXYzine  (ATARAX ) 25 MG tablet Take 1 tablet (25 mg total) by mouth 3 (three) times daily as needed for anxiety. 02/15/24   Deatra Face, MD  ondansetron  (ZOFRAN -ODT) 4 MG disintegrating tablet Take 1 tablet (4 mg total) by mouth every 8 (eight) hours as needed. 01/21/24   Lucina Sabal, PA-C  pantoprazole  (PROTONIX ) 40 MG tablet Take 1 tablet (40 mg total) by mouth 2 (two) times daily before a meal. 02/15/24  Deatra Face, MD  promethazine  (PHENERGAN ) 25 MG tablet Take 1 tablet (25 mg total) by mouth every 6 (six) hours as needed for refractory nausea / vomiting. 02/15/24   Deatra Face, MD  diphenhydrAMINE  (BENADRYL ) 25 mg capsule Take 1 capsule (25 mg total) by mouth every 8 (eight) hours as needed for allergies. 07/19/19 10/18/19  Betancourt, Cleotis Daily, NP  loratadine  (CLARITIN ) 10 MG tablet Take 1 tablet (10 mg total) by mouth daily. 07/19/19 10/18/19  Betancourt, Cleotis Daily, NP      Allergies    Kiwi extract and Other    Review of Systems   Review of Systems  All  other systems reviewed and are negative.   Physical Exam Updated Vital Signs BP 107/71   Pulse 80   Temp 98.6 F (37 C)   Resp 19   LMP 01/18/2024   SpO2 100%  Physical Exam Vitals and nursing note reviewed.  Constitutional:      Appearance: She is well-developed.  HENT:     Head: Atraumatic.  Cardiovascular:     Rate and Rhythm: Normal rate.  Pulmonary:     Effort: Pulmonary effort is normal.  Abdominal:     General: Abdomen is flat.     Tenderness: There is no abdominal tenderness.  Musculoskeletal:     Cervical back: Normal range of motion and neck supple.  Skin:    General: Skin is warm and dry.  Neurological:     General: No focal deficit present.     Mental Status: She is alert and oriented to person, place, and time.     Cranial Nerves: No cranial nerve deficit.     Motor: No weakness.     Comments: Cerebellar exam is normal (finger to nose) Sensory exam normal for bilateral upper and lower extremities - and patient is able to discriminate between sharp and dull. Motor exam is 4+/5 No nystagmus     ED Results / Procedures / Treatments   Labs (all labs ordered are listed, but only abnormal results are displayed) Labs Reviewed  URINALYSIS, ROUTINE W REFLEX MICROSCOPIC - Abnormal; Notable for the following components:      Result Value   APPearance HAZY (*)    Hgb urine dipstick MODERATE (*)    Bacteria, UA RARE (*)    All other components within normal limits  COMPREHENSIVE METABOLIC PANEL WITH GFR  CBC WITH DIFFERENTIAL/PLATELET  PREGNANCY, URINE  RAPID URINE DRUG SCREEN, HOSP PERFORMED    EKG EKG Interpretation Date/Time:  Monday February 15 2024 09:47:28 EDT Ventricular Rate:  73 PR Interval:  177 QRS Duration:  81 QT Interval:  385 QTC Calculation: 425 R Axis:   31  Text Interpretation: Sinus rhythm Low voltage, precordial leads No acute changes No significant change since last tracing Confirmed by Deatra Face ([id number]) on 02/15/2024 10:21:30  AM  Radiology No results found.  Procedures Procedures    Medications Ordered in ED Medications  pantoprazole  (PROTONIX ) injection 80 mg (80 mg Intravenous Given 02/15/24 1153)  metoCLOPramide  (REGLAN ) injection 10 mg (10 mg Intravenous Given 02/15/24 1153)    ED Course/ Medical Decision Making/ A&P                                 Medical Decision Making Amount and/or Complexity of Data Reviewed Labs: ordered.  Risk Prescription drug management.  This patient presents to the ED with chief complaint(s) of syncope, persistent dizziness and  nausea with pertinent past medical history of admission for her GI illness and syncope recently.she has liver follow-up coming up..The complaint involves an extensive differential diagnosis and also carries with it a high risk of complications and morbidity.    The differential diagnosis includes : Orthostatic hypotension Stroke Vertebral artery dissection/stenosis Dysrhythmia PE Vasovagal/neurocardiogenic syncope Aortic stenosis Valvular disorder/Cardiomyopathy Anemia GI dysmotility Endocrine disorder  Neuroendocrine disorder/tumor   The initial plan is to get basic labs.  I think it is prudent that patient gets GI follow-up for additional workup.  For syncope, it does not appear that this is cardiac or primary neurologic issue.  She has no neurodeficits or complaints at this time.  She has no cardiac disease history.  PERC negative.   Additional history obtained: Records reviewed previous admission documents  Independent labs interpretation:  The following labs were independently interpreted: CBC, CMP is completely normal.  Vital signs are stable here.  Patient's EKG is reassuring.   Treatment and Reassessment: Patient given medications in the ER that she was able to tolerate. We will give her further prescription, she is running low on her prescriptions and she does not have a PCP.  Consideration for admission or further  workup: I did consider admission for syncope, however this all appears to be related to her ongoing condition.  Her dizziness has been fairly constant.  She has had near syncope in the past as well.  She has no cardiac disease history or risk factors.   12:17 PM Pt reassessed. Pt's VSS and WNL. Pt's cap refill < 3 seconds. Pt has been hydrated in the ER and now passed po challenge. We will discharge with antiemetic. Strict ER return precautions have been discussed and pt will return if he is unable to tolerate fluids and symptoms are getting worse.' The patient appears reasonably screened and/or stabilized for discharge and I doubt any other medical condition or other [facility] requiring further screening, evaluation, or treatment in the ED at this time prior to discharge.   Results from the ER workup discussed with the patient face to face and all questions answered to the best of my ability. The patient is safe for discharge with strict return precautions.   Final Clinical Impression(s) / ED Diagnoses Final diagnoses:  Syncope and collapse  Dizziness  Chronic nausea    Rx / DC Orders ED Discharge Orders          Ordered    cyclobenzaprine  (FLEXERIL ) 5 MG tablet  3 times daily PRN        02/15/24 1217    hydrOXYzine  (ATARAX ) 25 MG tablet  3 times daily PRN        02/15/24 1217    pantoprazole  (PROTONIX ) 40 MG tablet  2 times daily before meals        02/15/24 1217    promethazine  (PHENERGAN ) 25 MG tablet  Every 6 hours PRN        02/15/24 1217              Deatra Face, MD 02/15/24 1217

## 2024-02-15 NOTE — ED Notes (Addendum)
 Pt drinking water with no episodes of pain or emesis.  Pt provided discharge instructions and prescription information. Pt was given the opportunity to ask questions and questions were answered.

## 2024-02-15 NOTE — Discharge Instructions (Signed)
 We saw you in the ER for dizziness, fainting, chronic nausea All the results in the ER are normal, labs and imaging. We are not sure what is causing your symptoms. The workup in the ER is not complete, and is limited to screening for life threatening and emergent conditions only, so please see a primary care doctor for further evaluation.

## 2024-02-19 ENCOUNTER — Other Ambulatory Visit (INDEPENDENT_AMBULATORY_CARE_PROVIDER_SITE_OTHER): Payer: Self-pay

## 2024-02-19 ENCOUNTER — Encounter: Payer: Self-pay | Admitting: Gastroenterology

## 2024-02-19 ENCOUNTER — Ambulatory Visit (INDEPENDENT_AMBULATORY_CARE_PROVIDER_SITE_OTHER): Payer: Self-pay | Admitting: Gastroenterology

## 2024-02-19 VITALS — BP 122/88 | HR 90 | Ht 61.0 in | Wt 234.0 lb

## 2024-02-19 DIAGNOSIS — R112 Nausea with vomiting, unspecified: Secondary | ICD-10-CM

## 2024-02-19 DIAGNOSIS — F419 Anxiety disorder, unspecified: Secondary | ICD-10-CM

## 2024-02-19 DIAGNOSIS — R1013 Epigastric pain: Secondary | ICD-10-CM

## 2024-02-19 DIAGNOSIS — R1084 Generalized abdominal pain: Secondary | ICD-10-CM

## 2024-02-19 DIAGNOSIS — K5900 Constipation, unspecified: Secondary | ICD-10-CM

## 2024-02-19 DIAGNOSIS — K219 Gastro-esophageal reflux disease without esophagitis: Secondary | ICD-10-CM

## 2024-02-19 DIAGNOSIS — R932 Abnormal findings on diagnostic imaging of liver and biliary tract: Secondary | ICD-10-CM

## 2024-02-19 DIAGNOSIS — R140 Abdominal distension (gaseous): Secondary | ICD-10-CM

## 2024-02-19 LAB — C-REACTIVE PROTEIN: CRP: 1 mg/dL (ref 0.5–20.0)

## 2024-02-19 NOTE — Patient Instructions (Addendum)
 _______________________________________________________  If your blood pressure at your visit was 140/90 or greater, please contact your primary care physician to follow up on this.  _______________________________________________________  If you are age 28 or older, your body mass index should be between 23-30. Your Body mass index is 44.21 kg/m. If this is out of the aforementioned range listed, please consider follow up with your Primary Care Provider.  If you are age 48 or younger, your body mass index should be between 19-25. Your Body mass index is 44.21 kg/m. If this is out of the aformentioned range listed, please consider follow up with your Primary Care Provider.   ________________________________________________________  The [facility] providers would like to encourage you to use MYCHART to communicate with providers for non-urgent requests or questions.  Due to long hold times on the telephone, sending your provider a message by [facility] may be a faster and more efficient way to get a response.  Please allow 48 business hours for a response.  Please remember that this is for non-urgent requests.  _______________________________________________________  Your provider has requested that you go to the basement level for lab work before leaving today. Press "B" on the elevator. The lab is located at the first door on the left as you exit the elevator.  You have been scheduled for an MRI at [facility] on 02-24-24. Your appointment time is 930am. Please arrive to admitting (at main entrance of the hospital) 30 minutes prior to your appointment time for registration purposes. Please make certain not to have anything to eat or drink 6 hours prior to your test. In addition, if you have any metal in your body, have a pacemaker or defibrillator, please be sure to let your ordering physician know. This test typically takes 45 minutes to 1 hour to complete. Should you need to reschedule,  please call [phone] to do so.  Please purchase the following medications over the counter and take as directed: Fiber supplement Stool softener  A suggestion for behavorial health is [facility] at [facility]  [address] [place] [address]  [phone]  You have been scheduled for an endoscopy. Please follow written instructions given to you at your visit today.  If you use inhalers (even only as needed), please bring them with you on the day of your procedure.  If you take any of the following medications, they will need to be adjusted prior to your procedure:   DO NOT TAKE 7 DAYS PRIOR TO TEST- Trulicity (dulaglutide) Ozempic, Wegovy (semaglutide) Mounjaro (tirzepatide) Bydureon Bcise (exanatide extended release)  DO NOT TAKE 1 DAY PRIOR TO YOUR TEST Rybelsus (semaglutide) Adlyxin (lixisenatide) Victoza (liraglutide) Byetta (exanatide) ___________________________________________________________________________  Thank you,  Dr. Lajuan Pila

## 2024-02-19 NOTE — Progress Notes (Signed)
 Regina Miller [id number] 08/15/1996   Chief Complaint: Nausea, vomiting, abdominal pain, constipation  Referring Provider: Audria Leather, MD Primary GI MD: Dr. Venice Gillis  HPI: Regina Miller is a 28 y.o. female with past medical history of ADHD, asthma, GERD, anxiety who presents today for a complaint of nausea, vomiting, abdominal pain, and constipation.    Patient was recently admitted from 01/23/2024 to 01/24/2024 for intractable vomiting with nausea (this was her fourth ED visit since 01/20/2024).  CT of the abdomen and pelvis showed no acute findings but did show 2 indeterminate hypoattenuating structures in the liver and nonemergent MRI of the abdomen for liver mass protocol was recommended.  She was treated with IV fluids and antiemetics.  Her symptoms improved during her hospitalization and she was advised to follow-up with GI outpatient.    The cause of her intractable nausea and vomiting was not identified but acute gastritis versus peptic ulcer disease were considered.  She had a negative workup including a normal lipase, normal LFTs, negative urine drug screen.    Patient states that for the past month she has felt nauseated most of the time.  Nausea occurs daily, but does come and go throughout the day.  She has had some occasional vomiting but denies any coffee-ground emesis or hematemesis.  She thinks the nausea is primarily tied to stress and anxiety, and finds that she feels a lot better when she is relaxing at home.  Cold foods also seem to be a trigger and cause generalized abdominal pain and discomfort.  She reports that before this last month, her bowel movements were typically soft and loose, but lately has been having hard stool and constipation, having to strain with bowel movements and passing hard balls of stool.  She has had a lot of gas and bloating as well, and her abdominal pain does improve when she passes gas.  She denies any blood in her stool or melena.   She  reports that Protonix  once a day is helping with her heartburn and acid reflux, but she is still having some breakthrough symptoms and reports having intermittent sharp pains in her epigastrium.  She reports that in the past drinking alcohol and chewing gum would cause abdomen to "light up."  She has some bloating after eating bread and wonders if she may have celiac disease.  She does have a history of anxiety, reports she is taking hydroxyzine  right now and was previously on propranolol  but stopped taking this.  She states that she is currently not seeing anyone for her anxiety.  Denies a history of migraines.  Has been taking Flexeril  and promethazine  only at night due to side effect of drowsiness.  She denies any family history of colon, stomach, or liver cancer.   Previous GI WU   CT AP with contrast 01/21/2024 1. No acute inflammatory process identified within the abdomen or pelvis. 2. There are 2, indeterminate hypoattenuating structures in the liver with largest measuring up to 9 x 12 mm. Further evaluation with nonemergent MRI abdomen as per liver mass protocol is recommended. 3. Multiple other nonacute observations, as described above.   Past Medical History:  Diagnosis Date   Acid reflux    Anxiety    Asthma    Attention deficit disorder    Heart murmur    Reports she had a heart murmur as a baby that has been evaluated. She is not currently followed by a cardiologist.    Past Surgical History:  Procedure  Laterality Date   TONSILLECTOMY     TYMPANOSTOMY TUBE PLACEMENT Bilateral     Current Outpatient Medications  Medication Sig Dispense Refill   alum & mag hydroxide-simeth (MAALOX MAX) 400-400-40 MG/5ML suspension Take 15 mLs by mouth every 6 (six) hours as needed for indigestion. 355 mL 0   calcium  carbonate (TUMS - DOSED IN MG ELEMENTAL CALCIUM ) 500 MG chewable tablet Chew 2 tablets by mouth as needed for indigestion or heartburn.     cyclobenzaprine  (FLEXERIL ) 5  MG tablet Take 1 tablet (5 mg total) by mouth 3 (three) times daily as needed for muscle spasms. 30 tablet 0   dicyclomine  (BENTYL ) 20 MG tablet Take 1 tablet (20 mg total) by mouth 2 (two) times daily. 20 tablet 0   hydrOXYzine  (ATARAX ) 25 MG tablet Take 1 tablet (25 mg total) by mouth 3 (three) times daily as needed for anxiety. 30 tablet 1   ondansetron  (ZOFRAN -ODT) 4 MG disintegrating tablet Take 1 tablet (4 mg total) by mouth every 8 (eight) hours as needed. 20 tablet 0   pantoprazole  (PROTONIX ) 40 MG tablet Take 1 tablet (40 mg total) by mouth 2 (two) times daily before a meal. 60 tablet 1   promethazine  (PHENERGAN ) 25 MG tablet Take 1 tablet (25 mg total) by mouth every 6 (six) hours as needed for refractory nausea / vomiting. 20 tablet 0   No current facility-administered medications for this visit.    Allergies as of 02/19/2024 - Review Complete 02/19/2024  Allergen Reaction Noted   Kiwi extract Nausea And Vomiting 07/23/2021   Other Hives 07/29/2015    Family History  Problem Relation Age of Onset   Psoriasis Mother    Hypertension Father    Eczema Father    Diabetes Father    Cancer Maternal Grandmother    Heart disease Paternal Grandmother    Colon cancer Neg Hx    Esophageal cancer Neg Hx    Liver disease Neg Hx     Social History   Tobacco Use   Smoking status: Former    Current packs/day: 1.00    Types: Cigarettes   Smokeless tobacco: Never   Tobacco comments:    Parents smoke around her  Vaping Use   Vaping status: Never Used  Substance Use Topics   Alcohol use: Yes    Comment: occ   Drug use: Not Currently     Review of Systems:    Constitutional: No weight loss, fever, chills, weakness or fatigue HEENT: Eyes: No change in vision Ears, Nose, Throat:  No change in hearing or congestion Skin: No rash or itching Cardiovascular: No chest pain, chest pressure or palpitations   Respiratory: No SOB or cough Gastrointestinal: See HPI and otherwise  negative Genitourinary: No dysuria or change in urinary frequency Neurological: No headache, dizziness or syncope Musculoskeletal: No new muscle or joint pain Hematologic: No bleeding or bruising Psychiatric: History of anxiety    Physical Exam:  Vital signs: BP 122/88   Pulse 90   Ht 5\' 1"  (1.549 m)   Wt 234 lb (106.1 kg)   LMP 01/18/2024   BMI 44.21 kg/m   Constitutional: NAD, obese, alert and cooperative Head:  Normocephalic and atraumatic. Eyes:  EOMs intact. No scleral icterus. Conjunctiva pink. Respiratory: Respirations even and unlabored. Lungs clear to auscultation bilaterally.  No wheezes, crackles, or rhonchi.  Cardiovascular:  Regular rate and rhythm. No peripheral edema, cyanosis or pallor.  Gastrointestinal:  Soft, nondistended, tender to palpation of epigastrium. No rebound or  guarding. Normal bowel sounds. No appreciable masses or hepatomegaly. Rectal:  Not performed.  Msk:  Symmetrical without gross deformities. Without edema, no deformity or joint abnormality.  Neurologic:  Alert and oriented x4;  grossly normal neurologically.  Skin:   Dry and intact without significant lesions or rashes. Psychiatric: Oriented to person, place and time. Demonstrates good judgement and reason, anxious.   RELEVANT LABS AND IMAGING: CBC    Component Value Date/Time   WBC 10.5 02/15/2024 1146   RBC 4.16 02/15/2024 1146   HGB 13.4 02/15/2024 1146   HCT 40.5 02/15/2024 1146   PLT 346 02/15/2024 1146   MCV 97.4 02/15/2024 1146   MCH 32.2 02/15/2024 1146   MCHC 33.1 02/15/2024 1146   RDW 12.6 02/15/2024 1146   LYMPHSABS 2.7 02/15/2024 1146   MONOABS 0.6 02/15/2024 1146   EOSABS 0.2 02/15/2024 1146   BASOSABS 0.0 02/15/2024 1146    CMP     Component Value Date/Time   NA 136 02/15/2024 1146   K 4.6 02/15/2024 1146   CL 104 02/15/2024 1146   CO2 24 02/15/2024 1146   GLUCOSE 96 02/15/2024 1146   BUN 11 02/15/2024 1146   CREATININE 0.70 02/15/2024 1146   CALCIUM  9.1  02/15/2024 1146   PROT 7.0 02/15/2024 1146   ALBUMIN 3.8 02/15/2024 1146   AST 19 02/15/2024 1146   ALT 21 02/15/2024 1146   ALKPHOS 67 02/15/2024 1146   BILITOT 0.6 02/15/2024 1146   GFRNONAA >60 02/15/2024 1146   GFRAA >60 06/02/2018 1235   CT abdomen and pelvis 01/21/2024 IMPRESSION: 1. No acute inflammatory process identified within the abdomen or pelvis. 2. There are 2, indeterminate hypoattenuating structures in the liver with largest measuring up to 9 x 12 mm. Further evaluation with nonemergent MRI abdomen as per liver mass protocol is recommended. 3. Multiple other nonacute observations, as described above.  Assessment/Plan:   Acid reflux Dyspepsia Epigastric pain - Increase Protonix  to 40mg  BID - Check labs today: Celiac panel, food allergy testing, CRP - Plan for EGD for further evaluation. I thoroughly discussed the procedure with the patient to include nature of the procedure, alternatives, benefits, and risks (including but not limited to bleeding, infection, perforation, anesthesia/cardiac/pulmonary complications). Patient verbalized understanding and gave verbal consent to proceed with procedure.  - If EGD unrevealing would consider gastric emptying study as next step  Abnormal CT finding, liver MRI Abdomen w/wo contrast to follow up on abnormal CT finding, liver  Constipation Gas/bloating - Recommend patient try OTC fiber supplement and/or stool softener for constipation (reports Miralax made her feel sick) - If no improvement at follow up can consider Linzess  Anxiety - Advised patient to see behavioral health for her anxiety - Possible her anxiety is contributing to her GI symptoms   Valiant Gaul, PA-C [facility] 02/19/2024, 2:31 PM  Patient Care Team: Patient, No Pcp Per as PCP - General (General Practice) Arnie Lao, MD as PCP - Pediatrics (Pediatrics)

## 2024-02-22 LAB — FOOD ALLERGY PROFILE

## 2024-02-22 LAB — INTERPRETATION:

## 2024-02-23 LAB — CELIAC PANEL 10
Antigliadin Abs, IgA: 6 U (ref 0–19)
Gliadin IgG: 2 U (ref 0–19)
IgA/Immunoglobulin A, Serum: 188 mg/dL (ref 87–352)
Tissue Transglut Ab: 3 U/mL (ref 0–5)

## 2024-02-24 ENCOUNTER — Ambulatory Visit (HOSPITAL_COMMUNITY)
Admission: RE | Admit: 2024-02-24 | Discharge: 2024-02-24 | Disposition: A | Discharge status: Home / Self Care | Payer: Self-pay | Source: Ambulatory Visit | Attending: Gastroenterology | Admitting: Gastroenterology

## 2024-02-24 DIAGNOSIS — R1084 Generalized abdominal pain: Secondary | ICD-10-CM | POA: Insufficient documentation

## 2024-02-24 DIAGNOSIS — R112 Nausea with vomiting, unspecified: Secondary | ICD-10-CM | POA: Insufficient documentation

## 2024-02-24 DIAGNOSIS — R1013 Epigastric pain: Secondary | ICD-10-CM | POA: Insufficient documentation

## 2024-02-24 DIAGNOSIS — K5900 Constipation, unspecified: Secondary | ICD-10-CM | POA: Insufficient documentation

## 2024-02-24 MED ORDER — GADOBUTROL 1 MMOL/ML IV SOLN
10.0000 mL | Freq: Once | INTRAVENOUS | Status: AC | PRN
Start: 2024-02-24 — End: 2024-02-24
  Administered 2024-02-24: 10 mL via INTRAVENOUS

## 2024-03-01 ENCOUNTER — Ambulatory Visit: Payer: Self-pay

## 2024-03-01 NOTE — Telephone Encounter (Signed)
Patient returned your call and asked that you call her back.   ?

## 2024-03-01 NOTE — Telephone Encounter (Signed)
 Patient notified of normal lab results per Dr Venice Gillis.

## 2024-04-08 ENCOUNTER — Encounter: Payer: Self-pay | Admitting: Gastroenterology

## 2024-05-16 ENCOUNTER — Ambulatory Visit: Payer: Self-pay | Admitting: Gastroenterology

## 2024-08-03 ENCOUNTER — Ambulatory Visit: Admission: EM | Admit: 2024-08-03 | Discharge: 2024-08-03 | Disposition: A | Discharge status: Home / Self Care

## 2024-08-03 DIAGNOSIS — R600 Localized edema: Secondary | ICD-10-CM

## 2024-08-03 DIAGNOSIS — H6993 Unspecified Eustachian tube disorder, bilateral: Secondary | ICD-10-CM

## 2024-08-03 MED ORDER — FLUTICASONE PROPIONATE 50 MCG/ACT NA SUSP: 2.0000 | Freq: Every day | NASAL | 0 refills | Status: AC

## 2024-08-03 NOTE — ED Triage Notes (Signed)
 Pt c/o oral swelling to left lower side of jaw. Denies pain. States she has a cracked molar. Worried about infection. No current dentist or appt scheduled. Also requesting ears be checked.

## 2024-08-03 NOTE — Discharge Instructions (Addendum)
 Increase your fluid intake Take Flonase  daily to help with sinus congestion and ear pressure  You need to get a sour candy like a lemon sour to stimulate salivary gland See your primary care doctor if it fails to improve

## 2024-08-04 ENCOUNTER — Telehealth: Payer: Self-pay | Admitting: Emergency Medicine

## 2024-08-04 NOTE — Telephone Encounter (Signed)
LMTRC.  Advised if doing well to disregard the call.  Any questions or concerns don't hesitate to contact the office.
